# Patient Record
Sex: Female | Born: 1990 | Race: Black or African American | Hispanic: No | Marital: Single | Smoking: Never smoker
Health system: Southern US, Community
[De-identification: ages and names within clinical notes are randomized; demographics above are authoritative.]

## PROBLEM LIST (undated history)

## (undated) ENCOUNTER — Inpatient Hospital Stay (HOSPITAL_COMMUNITY): Payer: Self-pay

## (undated) DIAGNOSIS — M51369 Other intervertebral disc degeneration, lumbar region without mention of lumbar back pain or lower extremity pain: Secondary | ICD-10-CM

## (undated) DIAGNOSIS — G4730 Sleep apnea, unspecified: Secondary | ICD-10-CM

## (undated) DIAGNOSIS — M5136 Other intervertebral disc degeneration, lumbar region: Secondary | ICD-10-CM

## (undated) DIAGNOSIS — R7303 Prediabetes: Secondary | ICD-10-CM

## (undated) DIAGNOSIS — T8859XA Other complications of anesthesia, initial encounter: Secondary | ICD-10-CM

## (undated) DIAGNOSIS — E282 Polycystic ovarian syndrome: Secondary | ICD-10-CM

---
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
## 2013-10-05 ENCOUNTER — Emergency Department (HOSPITAL_COMMUNITY)
Admission: EM | Admit: 2013-10-05 | Discharge: 2013-10-05 | Disposition: A | Discharge status: Home / Self Care | Payer: Self-pay | Attending: Emergency Medicine | Admitting: Emergency Medicine

## 2013-10-05 ENCOUNTER — Encounter (HOSPITAL_COMMUNITY): Payer: Self-pay | Admitting: Emergency Medicine

## 2013-10-05 DIAGNOSIS — R109 Unspecified abdominal pain: Secondary | ICD-10-CM | POA: Insufficient documentation

## 2013-10-05 DIAGNOSIS — R110 Nausea: Secondary | ICD-10-CM | POA: Insufficient documentation

## 2013-10-05 DIAGNOSIS — J069 Acute upper respiratory infection, unspecified: Secondary | ICD-10-CM | POA: Insufficient documentation

## 2013-10-05 LAB — RAPID STREP SCREEN (MED CTR MEBANE ONLY): Streptococcus, Group A Screen (Direct): NEGATIVE

## 2013-10-05 NOTE — ED Provider Notes (Signed)
CSN: [id number]     Arrival date & time 10/05/13  1429 History   First MD Initiated Contact with Patient 10/05/13 1548     Chief Complaint  Patient presents with  . Sore Throat  . Nasal Congestion  . Abdominal Pain   (Consider location/radiation/quality/duration/timing/severity/associated sxs/prior Treatment) The history is provided by the patient. No language interpreter was used.  Emily Morgan is a 22 y/o F with no significant PMHx presenting to the ED with sore throat, cough, and nasal congestion that has been ongoing for the past 5 days. Patient reported that the headache is localized to the temple region, described as a pressure sensation. Patient reported that the cough that she has is productive, reported yellowish, greenish phlegm. Patient reported that her son recently got over a viral infection. Patient reported that her significant other just was treated for pneumonia. Denied fever, chills, vomiting, chest pain. PCP none   History reviewed. No pertinent past medical history. History reviewed. No pertinent past surgical history. History reviewed. No pertinent family history. History  Substance Use Topics  . Smoking status: Never Smoker   . Smokeless tobacco: Not on file  . Alcohol Use: Yes     Comment: 3-4 times a week   OB History   Grav Para Term Preterm Abortions TAB SAB Ect Mult Living                 Review of Systems  Constitutional: Positive for chills. Negative for fever.  HENT: Positive for congestion. Negative for trouble swallowing.   Respiratory: Positive for cough and chest tightness.   Cardiovascular: Negative for chest pain.  Gastrointestinal: Positive for nausea. Negative for vomiting, abdominal pain and diarrhea.  Musculoskeletal: Negative for back pain.  Neurological: Positive for headaches. Negative for dizziness, tremors and weakness.  All other systems reviewed and are negative.    Allergies  Review of patient's allergies indicates no known  allergies.  Home Medications  No current outpatient prescriptions on file. BP 111/66  Pulse 101  Temp(Src) 98.2 F (36.8 C) (Oral)  Resp 16  SpO2 98% Physical Exam  Nursing note and vitals reviewed. Constitutional: She is oriented to person, place, and time. She appears well-developed and well-nourished. No distress.  HENT:  Head: Normocephalic and atraumatic.  Mouth/Throat: Oropharynx is clear and moist. No oropharyngeal exudate.  Negative swelling, erythema, inflammation, lesions, sores exudate, petechiae noted to the posterior oropharynx and tonsils. Uvula midline, symmetrical elevation.   Eyes: Conjunctivae and EOM are normal. Pupils are equal, round, and reactive to light. Right eye exhibits no discharge. Left eye exhibits no discharge.  Neck: Normal range of motion. Neck supple.  Cardiovascular: Normal rate, regular rhythm and normal heart sounds.  Exam reveals no friction rub.   No murmur heard. Pulses:      Radial pulses are 2+ on the right side, and 2+ on the left side.  Pulmonary/Chest: Effort normal and breath sounds normal. No respiratory distress. She has no wheezes. She has no rales.  Musculoskeletal: Normal range of motion.  Lymphadenopathy:    She has no cervical adenopathy.  Neurological: She is oriented to person, place, and time. She exhibits normal muscle tone. Coordination normal.  Skin: Skin is warm and dry. No rash noted. She is not diaphoretic. No erythema.  Psychiatric: She has a normal mood and affect. Her behavior is normal. Thought content normal.    ED Course  Procedures (including critical care time)  Results for orders placed during the hospital encounter of 10/05/13  RAPID STREP SCREEN      Result Value Range   Streptococcus, Group A Screen (Direct) NEGATIVE  NEGATIVE    Labs Review Labs Reviewed  RAPID STREP SCREEN  CULTURE, GROUP A STREP   Imaging Review No results found.  EKG Interpretation   None       MDM   1. URI (upper  respiratory infection)    Filed Vitals:   10/05/13 1437 10/05/13 1730  BP: 125/72 111/66  Pulse: 101   Temp: 98.2 F (36.8 C)   TempSrc: Oral   Resp: 16   SpO2: 98%    I personally performed the services described in this documentation, which was scribed in my presence. The recorded information has been reviewed and is accurate.  Patient presenting to the ED with cough, nasal congestion, chills that has been ongoing for the past 5 days. Patient reported positive sick contacts with husband being treated for pneumonia and son having a viral infection.  Alert and oriented. Heart rate and rhythm normal. Lungs clear to auscultation to upper and lower lobes bilaterally. Full ROM to upper and lower extremities bilaterally. Unremarkable oral exam - negative swelling, erythema, inflammation, lesions, sores, petechiae, exudate noted to the posterior oropharynx and tonsils. Negative meningeal signs.  Rapid strep test negative.  Doubt strep pharyngitis. Doubt meningitis. Doubt pneumonia. Suspicion to be URI, viral infection. Patient stable, afebrile. Discharged patient. Discussed with patient to rest and stay hydrated. Discussed with patient supportive care. Referred to health and wellness center. Discussed with patient to closely monitor symptoms and if symptoms are to worsen or change to report back to the ED - strict return instructions given. Patient agreed to plan of care, understood, all questions answered.     Raymon Mutton, PA-C 10/06/13 [phone]

## 2013-10-05 NOTE — ED Notes (Signed)
For 5 days, c/o abdominal pain, headache and sore throat, c/o congestion, coughing, little nausea, no vomiting

## 2013-10-07 LAB — CULTURE, GROUP A STREP

## 2013-10-08 NOTE — ED Provider Notes (Signed)
Medical screening examination/treatment/procedure(s) were performed by non-physician practitioner and as supervising physician I was immediately available for consultation/collaboration.  EKG Interpretation   None         Suzi Roots, MD 10/08/13 [phone]

## 2014-09-24 ENCOUNTER — Emergency Department (HOSPITAL_COMMUNITY)
Admission: EM | Admit: 2014-09-24 | Discharge: 2014-09-25 | Disposition: A | Discharge status: Home / Self Care | Payer: Self-pay | Attending: Emergency Medicine | Admitting: Emergency Medicine

## 2014-09-24 ENCOUNTER — Encounter (HOSPITAL_COMMUNITY): Payer: Self-pay | Admitting: Emergency Medicine

## 2014-09-24 DIAGNOSIS — N760 Acute vaginitis: Secondary | ICD-10-CM | POA: Insufficient documentation

## 2014-09-24 DIAGNOSIS — Z792 Long term (current) use of antibiotics: Secondary | ICD-10-CM | POA: Insufficient documentation

## 2014-09-24 DIAGNOSIS — B3731 Acute candidiasis of vulva and vagina: Secondary | ICD-10-CM

## 2014-09-24 DIAGNOSIS — Z3202 Encounter for pregnancy test, result negative: Secondary | ICD-10-CM | POA: Insufficient documentation

## 2014-09-24 DIAGNOSIS — B373 Candidiasis of vulva and vagina: Secondary | ICD-10-CM | POA: Insufficient documentation

## 2014-09-24 LAB — POC URINE PREG, ED: Preg Test, Ur: NEGATIVE

## 2014-09-24 NOTE — ED Notes (Signed)
Pt states she was seen at [facility] 2 to 3 days ago and was diagnosed with a UTI  Pt states she does not think she has a UTI because she is not having frequency or dysuria  Pt states she does have pressure in her perineum and feels like she is swollen and states she has stinging and itching

## 2014-09-25 LAB — WET PREP, GENITAL
CLUE CELLS WET PREP: NONE SEEN
TRICH WET PREP: NONE SEEN

## 2014-09-25 LAB — URINALYSIS, ROUTINE W REFLEX MICROSCOPIC
Bilirubin Urine: NEGATIVE
Glucose, UA: NEGATIVE mg/dL
Ketones, ur: NEGATIVE mg/dL
Nitrite: NEGATIVE
Protein, ur: 30 mg/dL — AB
Specific Gravity, Urine: 1.035 — ABNORMAL HIGH (ref 1.005–1.030)
Urobilinogen, UA: 1 mg/dL (ref 0.0–1.0)
pH: 6 (ref 5.0–8.0)

## 2014-09-25 LAB — URINE MICROSCOPIC-ADD ON

## 2014-09-25 MED ORDER — FLUCONAZOLE 150 MG PO TABS
150.0000 mg | ORAL_TABLET | Freq: Once | ORAL | Status: AC
  Administered 2014-09-25: 150 mg via ORAL
  Filled 2014-09-25: qty 1

## 2014-09-25 NOTE — Discharge Instructions (Signed)

## 2014-09-25 NOTE — ED Provider Notes (Signed)
CSN: [id number]     Arrival date & time 11/25409811914/15  2240 History   First MD Initiated Contact with Patient 09/24/14 2310     Chief Complaint  Patient presents with  . Vaginal Itching     (Consider location/radiation/quality/duration/timing/severity/associated sxs/prior Treatment) HPI Comments: Patient presents today with a chief complaint of vaginal itching and irritation.  She reports that she was seen in the ED three days ago at [facility].  At that time she was diagnosed with a UTI.  She was given a Rx for Bactrim DS, which she has been taking.  She does not feel that her symptoms are improving.  She reports that she never had any urinary symptoms and therefore, does not feel that she has a UTI.  She denies any urinary symptoms at this time.  No fever, chills, nausea, vomiting, or flank pain.    Patient is a 23 y.o. female presenting with vaginal itching. The history is provided by the patient.  Vaginal Itching    History reviewed. No pertinent past medical history. Past Surgical History  Procedure Laterality Date  . Cesarean section     History reviewed. No pertinent family history. History  Substance Use Topics  . Smoking status: Never Smoker   . Smokeless tobacco: Not on file  . Alcohol Use: Yes     Comment: 3-4 times a week   OB History    No data available     Review of Systems  All other systems reviewed and are negative.     Allergies  Review of patient's allergies indicates no known allergies.  Home Medications   Prior to Admission medications   Medication Sig Start Date End Date Taking? Authorizing Provider  Aspirin-Salicylamide-Caffeine (BC HEADACHE POWDER PO) Take 1 packet by mouth every 4 (four) hours as needed (pain).   Yes Historical Provider, MD  sulfamethoxazole-trimethoprim (BACTRIM DS,SEPTRA DS) 800-160 MG per tablet Take 1 tablet by mouth 2 (two) times daily. For 10 days   Yes Historical Provider, MD   BP 150/77 mmHg  Pulse 84   Temp(Src) 98.4 F (36.9 C) (Oral)  Resp 20  SpO2 100%  LMP 09/03/2014 (Approximate) Physical Exam  Constitutional: She appears well-developed and well-nourished.  HENT:  Head: Normocephalic and atraumatic.  Mouth/Throat: Oropharynx is clear and moist.  Eyes: EOM are normal.  Neck: Normal range of motion.  Cardiovascular: Normal rate, regular rhythm and normal heart sounds.   Pulmonary/Chest: Effort normal and breath sounds normal.  Abdominal: Soft. Bowel sounds are normal. She exhibits no distension and no mass. There is no tenderness. There is no rebound and no guarding.  Genitourinary: Cervix exhibits no motion tenderness. Right adnexum displays no mass, no tenderness and no fullness. Left adnexum displays no mass, no tenderness and no fullness.  Whitish colored discharge present in the vaginal vault  Musculoskeletal: Normal range of motion.  Neurological: She is alert.  Skin: Skin is warm and dry.  Psychiatric: She has a normal mood and affect.  Nursing note and vitals reviewed.   ED Course  Procedures (including critical care time) Labs Review Labs Reviewed  WET PREP, GENITAL - Abnormal; Notable for the following:    Yeast Wet Prep HPF POC FEW (*)    WBC, Wet Prep HPF POC TOO NUMEROUS TO COUNT (*)    All other components within normal limits  GC/CHLAMYDIA PROBE AMP  URINALYSIS, ROUTINE W REFLEX MICROSCOPIC  POC URINE PREG, ED    Imaging Review No results found.  EKG Interpretation None      MDM   Final diagnoses:  None   Patient presenting with a chief complaint of vaginal itching and irritation.  Thick whitish colored discharge on exam consistent with Candida.  Wet prep showing yeast.  No CMT or adnexal tenderness on exam.  Urine pregnancy negative.  Patient given Diflucan.  She is currently taking Bactrim DS to treat a UTI.  Urine cultured.  She is stable for discharge.  Return precautions given.      Santiago GladHeather , PA-C 09/26/14 2151  Santiago GladHeather  , PA-C 09/26/14 [id number]  Elwin MochaBlair Walden, MD 09/29/14 [phone]

## 2014-09-26 LAB — URINE CULTURE
CULTURE: NO GROWTH
Colony Count: NO GROWTH

## 2014-09-26 LAB — GC/CHLAMYDIA PROBE AMP
CT PROBE, AMP APTIMA: NEGATIVE
GC Probe RNA: NEGATIVE

## 2015-11-25 ENCOUNTER — Encounter (HOSPITAL_COMMUNITY): Payer: Self-pay

## 2015-11-25 ENCOUNTER — Emergency Department (HOSPITAL_COMMUNITY)
Admission: EM | Admit: 2015-11-25 | Discharge: 2015-11-25 | Disposition: A | Discharge status: Home / Self Care | Payer: Managed Care, Other (non HMO) | Attending: Emergency Medicine | Admitting: Emergency Medicine

## 2015-11-25 DIAGNOSIS — R110 Nausea: Secondary | ICD-10-CM | POA: Diagnosis not present

## 2015-11-25 DIAGNOSIS — R197 Diarrhea, unspecified: Secondary | ICD-10-CM | POA: Diagnosis not present

## 2015-11-25 DIAGNOSIS — J029 Acute pharyngitis, unspecified: Secondary | ICD-10-CM | POA: Diagnosis present

## 2015-11-25 DIAGNOSIS — J069 Acute upper respiratory infection, unspecified: Secondary | ICD-10-CM | POA: Insufficient documentation

## 2015-11-25 LAB — RAPID STREP SCREEN (MED CTR MEBANE ONLY): STREPTOCOCCUS, GROUP A SCREEN (DIRECT): NEGATIVE

## 2015-11-25 NOTE — ED Provider Notes (Signed)
CSN: [id number]     Arrival date & time 11/25/15  1057 History   First MD Initiated Contact with Patient 11/25/15 1152     Chief Complaint  Patient presents with  . URI  . Sore Throat     (Consider location/radiation/quality/duration/timing/severity/associated sxs/prior Treatment) Patient is a 25 y.o. female presenting with URI and pharyngitis. The history is provided by the patient and medical records.  URI Presenting symptoms: congestion, cough, fever and sore throat   Sore Throat Associated symptoms include congestion, coughing, a fever and a sore throat.    25 y.o. F seen yesterday at [facility], started on azithromycin and has taken 1 doses thus far.  She reports nasal congestion, sore throat, sinus congestion x 3 days.  Also reports cough that is productive intermittently.  No chest pain or SOB.  She reports nausea without vomiting, diarrhea x3.  Fever 102F orally x2 over the past 3 days.  Been taking motrin and tylenol at home.  Biggest issues is that she breaths through her mouth at night which causes sore throat.  Also states she needs a work note.  VSS.  History reviewed. No pertinent past medical history. Past Surgical History  Procedure Laterality Date  . Cesarean section     No family history on file. Social History  Substance Use Topics  . Smoking status: Never Smoker   . Smokeless tobacco: None  . Alcohol Use: Yes     Comment: 3-4 times a week   OB History    No data available     Review of Systems  Constitutional: Positive for fever.  HENT: Positive for congestion and sore throat.   Respiratory: Positive for cough.   All other systems reviewed and are negative.     Allergies  Review of patient's allergies indicates no known allergies.  Home Medications   Prior to Admission medications   Medication Sig Start Date End Date Taking? Authorizing Provider  Aspirin-Salicylamide-Caffeine (BC HEADACHE POWDER PO) Take 1 packet by mouth every 4 (four) hours as needed  (pain).    Historical Provider, MD  sulfamethoxazole-trimethoprim (BACTRIM DS,SEPTRA DS) 800-160 MG per tablet Take 1 tablet by mouth 2 (two) times daily. For 10 days    Historical Provider, MD   BP 111/73 mmHg  Pulse 87  Temp(Src) 97.7 F (36.5 C) (Oral)  Resp 16  Ht  (1.727 m)  Wt 115.214 kg  BMI 38.63 kg/m2  SpO2 99%  LMP 11/04/2015   Physical Exam  Constitutional: She is oriented to person, place, and time. She appears well-developed and well-nourished. No distress.  HENT:  Head: Normocephalic and atraumatic.  Right Ear: Tympanic membrane and ear canal normal.  Left Ear: Ear canal normal.  Nose: Mucosal edema present. Right sinus exhibits maxillary sinus tenderness. Left sinus exhibits maxillary sinus tenderness.  Mouth/Throat: Uvula is midline and mucous membranes are normal. Posterior oropharyngeal erythema present. No oropharyngeal exudate, posterior oropharyngeal edema or tonsillar abscesses.  Tonsils normal in appearance bilaterally without exudate; slight oropharyngeal erythema, no edema; uvula midline without peritonsillar abscess; handling secretions appropriately; no difficulty swallowing or speaking  Eyes: Conjunctivae and EOM are normal. Pupils are equal, round, and reactive to light.  Neck: Normal range of motion.  Cardiovascular: Normal rate, regular rhythm and normal heart sounds.   Pulmonary/Chest: Effort normal and breath sounds normal. No respiratory distress. She has no wheezes.  Abdominal: Soft. Bowel sounds are normal.  Musculoskeletal: Normal range of motion.  Neurological: She is alert and oriented to person,  place, and time.  Skin: Skin is warm and dry. She is not diaphoretic.  Psychiatric: She has a normal mood and affect.  Nursing note and vitals reviewed.   ED Course  Procedures (including critical care time) Labs Review Labs Reviewed  RAPID STREP SCREEN (NOT AT [facility])  CULTURE, GROUP A STREP [facility])    Imaging Review No results found. I  have personally reviewed and evaluated these images and lab results as part of my medical decision-making.   EKG Interpretation None      MDM   Final diagnoses:  URI (upper respiratory infection)   25 year old female here with cough and sore throat. Seen yesterday at [facility], I reviewed chart-- patient was presumably diagnosed with pneumonia, she did not have chest x-ray performed. Patient is afebrile and nontoxic on exam. Her lungs are clear without wheezes or rhonchi. She has slight erythema of her oropharynx without tonsillar edema or exudates. Rapid strep was sent and is negative, culture pending. I feel there is a strong possibility that her symptoms are viral in nature, however since she has already started the azithromycin for coverage of CAP i have encouraged her to finish this. Do not feel she needs imaging at this time given she is already on abx.  I've instructed her she may also take over-the-counter decongestants to help with symptoms.  Discussed plan with patient, he/she acknowledged understanding and agreed with plan of care.  Return precautions given for new or worsening symptoms.   Garlon Hatchet, PA-C 11/25/15 1301  Laurence Spates, MD 11/25/15 [phone]

## 2015-11-25 NOTE — Discharge Instructions (Signed)
Finish the azithromycin you have been prescribed. You may take tylenol or motrin as needed. Return here for new concerns.

## 2015-11-25 NOTE — ED Notes (Signed)
She c/o cough/congestion and sore throat for ~5 days.  She was seen at [facility], who rx with azithromycin  A couple of days ago--"not better yet".

## 2015-11-27 LAB — CULTURE, GROUP A STREP (THRC)

## 2016-01-28 ENCOUNTER — Emergency Department (HOSPITAL_COMMUNITY)
Admission: EM | Admit: 2016-01-28 | Discharge: 2016-01-28 | Discharge status: Against Medical Advice | Payer: Managed Care, Other (non HMO)

## 2016-01-28 NOTE — ED Notes (Signed)
Pt. Has been called 3 times and no answer

## 2017-01-15 ENCOUNTER — Emergency Department (HOSPITAL_COMMUNITY): Payer: Managed Care, Other (non HMO)

## 2017-01-15 ENCOUNTER — Encounter (HOSPITAL_COMMUNITY): Payer: Self-pay | Admitting: Emergency Medicine

## 2017-01-15 ENCOUNTER — Emergency Department (HOSPITAL_COMMUNITY)
Admission: EM | Admit: 2017-01-15 | Discharge: 2017-01-15 | Disposition: A | Discharge status: Home / Self Care | Payer: Managed Care, Other (non HMO) | Attending: Emergency Medicine | Admitting: Emergency Medicine

## 2017-01-15 DIAGNOSIS — M25572 Pain in left ankle and joints of left foot: Secondary | ICD-10-CM | POA: Diagnosis not present

## 2017-01-15 DIAGNOSIS — Z5321 Procedure and treatment not carried out due to patient leaving prior to being seen by health care provider: Secondary | ICD-10-CM | POA: Diagnosis not present

## 2017-01-15 NOTE — ED Triage Notes (Signed)
Pt c/o L ankle pain that woke her from sleep. Pt states [facility] and did a lot of running, pt c/o L ankle pain at rest and with activity. No swelling noted, pt denies injury.

## 2017-01-15 NOTE — ED Notes (Signed)
Called for room x2 no response. 

## 2017-01-15 NOTE — ED Notes (Signed)
Called x1 no response

## 2017-01-15 NOTE — ED Notes (Signed)
Call x 3 no response

## 2017-01-15 NOTE — ED Notes (Signed)
Bed: WTR8 Expected date:  Expected time:  Means of arrival:  Comments: 

## 2017-01-16 ENCOUNTER — Encounter (HOSPITAL_COMMUNITY): Payer: Self-pay | Admitting: Emergency Medicine

## 2017-01-16 ENCOUNTER — Emergency Department (HOSPITAL_COMMUNITY)
Admission: EM | Admit: 2017-01-16 | Discharge: 2017-01-16 | Disposition: A | Discharge status: Home / Self Care | Payer: Managed Care, Other (non HMO) | Attending: Emergency Medicine | Admitting: Emergency Medicine

## 2017-01-16 ENCOUNTER — Emergency Department (HOSPITAL_COMMUNITY): Payer: Managed Care, Other (non HMO)

## 2017-01-16 DIAGNOSIS — Z7982 Long term (current) use of aspirin: Secondary | ICD-10-CM | POA: Diagnosis not present

## 2017-01-16 DIAGNOSIS — M25572 Pain in left ankle and joints of left foot: Secondary | ICD-10-CM | POA: Diagnosis present

## 2017-01-16 NOTE — ED Triage Notes (Signed)
Pt reports ongoing L ankle pain since Sunday am. No known injury.

## 2017-01-16 NOTE — ED Provider Notes (Signed)
[facility]-EMERGENCY DEPT Provider Note   CSN: [id number] Arrival date & time: 01/16/17  1118  By signing my name below, I, Sonum Patel, attest that this documentation has been prepared under the direction and in the presence of [employer], PA-C. Electronically Signed: Sonum Patel, Scribe. 01/16/17. 1:19 PM.  History   Chief Complaint Chief Complaint  Patient presents with  . Ankle Pain    The history is provided by the patient. No language interpreter was used.     HPI Comments: Emily Morgan is a 26 y.o. female who presents to the Emergency Department complaining of constant, gradually worsened left ankle pain that began yesterday morning. She states she went to a trampoline park but denies any known injury or trauma to the affected area. She denies associated swelling.    History reviewed. No pertinent past medical history.  There are no active problems to display for this patient.   Past Surgical History:  Procedure Laterality Date  . CESAREAN SECTION      OB History    No data available       Home Medications    Prior to Admission medications   Medication Sig Start Date End Date Taking? Authorizing Provider  Aspirin-Salicylamide-Caffeine (BC HEADACHE POWDER PO) Take 1 packet by mouth every 4 (four) hours as needed (pain).    Historical Provider, MD  sulfamethoxazole-trimethoprim (BACTRIM DS,SEPTRA DS) 800-160 MG per tablet Take 1 tablet by mouth 2 (two) times daily. For 10 days    Historical Provider, MD    Family History History reviewed. No pertinent family history.  Social History Social History  Substance Use Topics  . Smoking status: Never Smoker  . Smokeless tobacco: Never Used  . Alcohol use Yes     Comment: 3-4 times a week     Allergies   Patient has no known allergies.   Review of Systems Review of Systems  Musculoskeletal: Positive for arthralgias. Negative for joint swelling.  Neurological: Negative for numbness.     Physical  Exam Updated Vital Signs BP 116/80 (BP Location: Left Arm)   Pulse 82   Temp 98.1 F (36.7 C) (Oral)   Resp 18   Ht 5\' 8"  (1.727 m)   Wt 117.9 kg   LMP 12/25/2016   SpO2 100%   BMI 39.53 kg/m   Physical Exam  Constitutional: She is oriented to person, place, and time. She appears well-developed and well-nourished.  HENT:  Head: Normocephalic and atraumatic.  Cardiovascular: Normal rate.   Pulmonary/Chest: Effort normal.  Musculoskeletal:  No significant tenderness to palpation of the left ankle.  Patient has medial ankle pain with inversion, no significant pain with eversion.  Ankle is stable  Neurological: She is alert and oriented to person, place, and time.  Skin: Skin is warm and dry.  Psychiatric: She has a normal mood and affect.  Nursing note and vitals reviewed.    ED Treatments / Results  DIAGNOSTIC STUDIES: Oxygen Saturation is 100% on RA, normal by my interpretation.    COORDINATION OF CARE: 1:20 PM Discussed treatment plan with pt at bedside and pt agreed to plan.   Labs (all labs ordered are listed, but only abnormal results are displayed) Labs Reviewed - No data to display  EKG  EKG Interpretation None       Radiology Dg Ankle Complete Left  Result Date: 01/16/2017 CLINICAL DATA:  Medial left ankle pain, no injury. EXAM: LEFT ANKLE COMPLETE - 3+ VIEW COMPARISON:  None. FINDINGS: No acute osseous or  joint abnormality. IMPRESSION: No acute osseous or joint abnormality. Electronically Signed   By: Leanna BattlesMelinda  Blietz M.D.   On: 01/16/2017 12:47    Procedures Procedures (including critical care time)  Medications Ordered in ED Medications - No data to display   Initial Impression / Assessment and Plan / ED Course  I have reviewed the triage vital signs and the nursing notes.  Pertinent labs & imaging results that were available during my care of the patient were reviewed by me and considered in my medical decision making (see chart for  details).     26 year old female presents today with ankle sprain.  No significant signs of trauma on exam, symptomatic care instructions given, follow-up information given.  She verbalized understanding and agreement to today's plan had no further questions or concerns  Final Clinical Impressions(s) / ED Diagnoses   Final diagnoses:  Acute left ankle pain    New Prescriptions Discharge Medication List as of 01/16/2017  1:23 PM     I personally performed the services described in this documentation, which was scribed in my presence. The recorded information has been reviewed and is accurate.   Eyvonne MechanicJeffrey , PA-C 01/16/17 1926    Doug SouSam Jacubowitz, MD 01/16/17 [phone]

## 2017-01-16 NOTE — Discharge Instructions (Signed)
Please read attached information. If you experience any new or worsening signs or symptoms please return to the emergency room for evaluation. Please follow-up with your primary care provider or specialist as discussed.  °

## 2017-01-16 NOTE — ED Notes (Signed)
Attempted to discharge pt but pt was not found in the room.  Searched for pt in dept and pt was not found.  Pt last seen a/o x 4 and ambulatory. Pt assumed to have left without receiving discharge paperwork.

## 2018-10-26 ENCOUNTER — Encounter (HOSPITAL_COMMUNITY): Payer: Self-pay | Admitting: Emergency Medicine

## 2018-10-26 ENCOUNTER — Emergency Department (HOSPITAL_COMMUNITY)
Admission: EM | Admit: 2018-10-26 | Discharge: 2018-10-26 | Disposition: A | Discharge status: Home / Self Care | Payer: Managed Care, Other (non HMO) | Attending: Emergency Medicine | Admitting: Emergency Medicine

## 2018-10-26 ENCOUNTER — Emergency Department (HOSPITAL_COMMUNITY): Payer: Managed Care, Other (non HMO)

## 2018-10-26 DIAGNOSIS — B9789 Other viral agents as the cause of diseases classified elsewhere: Secondary | ICD-10-CM | POA: Insufficient documentation

## 2018-10-26 DIAGNOSIS — R05 Cough: Secondary | ICD-10-CM

## 2018-10-26 DIAGNOSIS — J069 Acute upper respiratory infection, unspecified: Secondary | ICD-10-CM | POA: Insufficient documentation

## 2018-10-26 DIAGNOSIS — R059 Cough, unspecified: Secondary | ICD-10-CM

## 2018-10-26 LAB — GROUP A STREP BY PCR: Group A Strep by PCR: NOT DETECTED

## 2018-10-26 MED ORDER — BENZONATATE 100 MG PO CAPS: 100.0000 mg | ORAL_CAPSULE | Freq: Three times a day (TID) | ORAL | 0 refills | Status: DC | PRN

## 2018-10-26 MED ORDER — IPRATROPIUM-ALBUTEROL 0.5-2.5 (3) MG/3ML IN SOLN
3.0000 mL | Freq: Once | RESPIRATORY_TRACT | Status: AC
  Administered 2018-10-26: 3 mL via RESPIRATORY_TRACT
  Filled 2018-10-26: qty 3

## 2018-10-26 NOTE — ED Triage Notes (Signed)
Pt c/o productive cough, body aches, congestion and sore throat since Tuesday.

## 2018-10-26 NOTE — ED Notes (Signed)
RT called r/t neb treatment

## 2018-10-26 NOTE — ED Notes (Signed)
Patient transported to X-ray 

## 2018-10-26 NOTE — Discharge Instructions (Signed)
It was my pleasure taking care of you today!   Your symptoms are likely due to a viral upper respiratory infection. Fortunately, we did not see evidence of serious infection and can treat your symptoms. Flonase for nasal congestion, tessalon as needed for cough. Alternate between Tylenol and ibuprofen as needed for body aches / fevers.   Rest, drink plenty of fluids to be sure you are staying hydrated.   Please follow up with your primary doctor for discussion of your diagnoses and further evaluation after today's visit if symptoms persist longer than 7 days; Return to the ER for high fevers, difficulty breathing or other concerning symptoms

## 2018-10-26 NOTE — ED Notes (Signed)
Patient transported to Ultrasound 

## 2018-10-26 NOTE — ED Provider Notes (Signed)
[facility]-EMERGENCY DEPT Provider Note   CSN: [id number] Arrival date & time: 10/26/18  0845     History   Chief Complaint Chief Complaint  Patient presents with  . Cough  . Nasal Congestion  . Generalized Body Aches    HPI Emily Morgan is a 27 y.o. female.  The history is provided by the patient and medical records. No language interpreter was used.  Cough  Associated symptoms include chills, sore throat, myalgias and wheezing. Pertinent negatives include no chest pain, no headaches and no shortness of breath.   Emily Morgan is an otherwise healthy 27 y.o. female who presents to the Emergency Department complaining of persistent cough, congestion, sore throat, body aches and fevers for 4 days. Tmax 102 at home. Taking OTC anti-pyretics which improve fevers/body aches temporarily, but sxs keep returning. No hx of asthma or lung disease. Feels as if she is wheezing, but has never wheezed before so she is unsure. No abdominal pain, n/v/d.   History reviewed. No pertinent past medical history.  There are no active problems to display for this patient.   Past Surgical History:  Procedure Laterality Date  . CESAREAN SECTION       OB History   No obstetric history on file.      Home Medications    Prior to Admission medications   Medication Sig Start Date End Date Taking? Authorizing Provider  Aspirin-Salicylamide-Caffeine (BC HEADACHE POWDER PO) Take 1 packet by mouth every 4 (four) hours as needed (pain).    [provider]  benzonatate (TESSALON) 100 MG capsule Take 1 capsule (100 mg total) by mouth 3 (three) times daily as needed for cough. 10/26/18   , Chase PicketJaime Pilcher, PA-C  sulfamethoxazole-trimethoprim (BACTRIM DS,SEPTRA DS) 800-160 MG per tablet Take 1 tablet by mouth 2 (two) times daily. For 10 days    [provider]    Family History No family history on file.  Social History Social History   Tobacco Use  .  Smoking status: Never Smoker  . Smokeless tobacco: Never Used  Substance Use Topics  . Alcohol use: Yes    Comment: 3-4 times a week  . Drug use: No     Allergies   Patient has no known allergies.   Review of Systems Review of Systems  Constitutional: Positive for chills and fever.  HENT: Positive for congestion and sore throat.   Respiratory: Positive for cough and wheezing. Negative for shortness of breath.   Cardiovascular: Negative for chest pain.  Gastrointestinal: Negative for abdominal pain, diarrhea, nausea and vomiting.  Genitourinary: Negative for dysuria, urgency and vaginal discharge.  Musculoskeletal: Positive for myalgias.  Skin: Negative for rash.  Neurological: Negative for headaches.     Physical Exam Updated Vital Signs BP 123/74 (BP Location: Left Arm)   Pulse 91   Temp 98.6 F (37 C) (Oral)   Resp 18   LMP 08/31/2018   SpO2 97%   Physical Exam Vitals signs and nursing note reviewed.  Constitutional:      General: She is not in acute distress.    Appearance: She is well-developed.     Comments: Nontoxic-appearing.  HENT:     Head: Normocephalic and atraumatic.     Mouth/Throat:     Comments: OP with erythema, mild tonsillar hypertrophy. No exudates.  Cardiovascular:     Rate and Rhythm: Normal rate and regular rhythm.     Heart sounds: Normal heart sounds. No murmur.  Pulmonary:  Effort: Pulmonary effort is normal. No respiratory distress.     Breath sounds: Normal breath sounds.     Comments: Faint expiratory wheezing bilaterally. No crackles. Speaking in full sentences without any difficulty.  Abdominal:     General: There is no distension.     Palpations: Abdomen is soft.     Tenderness: There is no abdominal tenderness.  Skin:    General: Skin is warm and dry.  Neurological:     Mental Status: She is alert and oriented to person, place, and time.      ED Treatments / Results  Labs (all labs ordered are listed, but only  abnormal results are displayed) Labs Reviewed  GROUP A STREP BY PCR    EKG None  Radiology Dg Chest 2 View  Result Date: 10/26/2018 CLINICAL DATA:  Cough EXAM: CHEST - 2 VIEW COMPARISON:  None available FINDINGS: Normal heart size and mediastinal contours. No acute infiltrate or edema. No effusion or pneumothorax. No acute osseous findings. IMPRESSION: Negative chest. Electronically Signed   By: Marnee SpringJonathon  Watts M.D.   On: 10/26/2018 09:37    Procedures Procedures (including critical care time)  Medications Ordered in ED Medications  ipratropium-albuterol (DUONEB) 0.5-2.5 (3) MG/3ML nebulizer solution 3 mL (3 mLs Nebulization Given 10/26/18 0955)     Initial Impression / Assessment and Plan / ED Course  I have reviewed the triage vital signs and the nursing notes.  Pertinent labs & imaging results that were available during my care of the patient were reviewed by me and considered in my medical decision making (see chart for details).    Emily Morgan is a 27 y.o. female who presents to ED for cough, congestion, fevers, sore throat, body aches. Most recent temp of 100 last night. No fever this am or in ED. No anti-pyretics today.    On exam, patient is afebrile, non-toxic appearing with a clear lung exam. Mild rhinorrhea and OP with erythema but no exudates.  CXR without PNA. Rapid strep negative.  Sxs today likely due to viral URI.Symptomatic home care instructions discussed. Rx for Tessalon given. PCP follow up strongly encouraged if symptoms persist. Reasons to return to ER discussed. All questions answered.   Blood pressure 123/74, pulse 91, temperature 98.6 F (37 C), temperature source Oral, resp. rate 18, last menstrual period 08/31/2018, SpO2 97 %.   Final Clinical Impressions(s) / ED Diagnoses   Final diagnoses:  Cough  Viral URI with cough    ED Discharge Orders         Ordered    benzonatate (TESSALON) 100 MG capsule  3 times daily PRN     10/26/18 1011             , Chase PicketJaime Pilcher, PA-C 10/26/18 1032    Maia PlanLong, Joshua G, MD 10/26/18 1839

## 2020-10-20 ENCOUNTER — Other Ambulatory Visit: Payer: Self-pay

## 2020-10-20 ENCOUNTER — Emergency Department (HOSPITAL_COMMUNITY): Payer: Self-pay

## 2020-10-20 ENCOUNTER — Encounter (HOSPITAL_COMMUNITY): Payer: Self-pay

## 2020-10-20 ENCOUNTER — Emergency Department (HOSPITAL_COMMUNITY)
Admission: EM | Admit: 2020-10-20 | Discharge: 2020-10-20 | Disposition: A | Discharge status: Home / Self Care | Payer: Self-pay | Attending: Emergency Medicine | Admitting: Emergency Medicine

## 2020-10-20 DIAGNOSIS — Z20822 Contact with and (suspected) exposure to covid-19: Secondary | ICD-10-CM | POA: Insufficient documentation

## 2020-10-20 DIAGNOSIS — J069 Acute upper respiratory infection, unspecified: Secondary | ICD-10-CM | POA: Insufficient documentation

## 2020-10-20 HISTORY — DX: Other intervertebral disc degeneration, lumbar region: M51.36

## 2020-10-20 HISTORY — DX: Polycystic ovarian syndrome: E28.2

## 2020-10-20 HISTORY — DX: Other intervertebral disc degeneration, lumbar region without mention of lumbar back pain or lower extremity pain: M51.369

## 2020-10-20 LAB — GROUP A STREP BY PCR: Group A Strep by PCR: NOT DETECTED

## 2020-10-20 LAB — RESP PANEL BY RT-PCR (FLU A&B, COVID) ARPGX2
Influenza A by PCR: NEGATIVE
Influenza B by PCR: NEGATIVE
SARS Coronavirus 2 by RT PCR: NEGATIVE

## 2020-10-20 LAB — POC URINE PREG, ED: Preg Test, Ur: NEGATIVE

## 2020-10-20 MED ORDER — PREDNISONE 20 MG PO TABS: 40.0000 mg | ORAL_TABLET | Freq: Every day | ORAL | 0 refills | Status: AC

## 2020-10-20 NOTE — ED Notes (Signed)
Discharge paperwork reviewed with pt, including prescription.  Pt with no questions or concerns at time of discharge, ambulatory to ED exit.  

## 2020-10-20 NOTE — Discharge Instructions (Signed)
Take prednisone as directed.  You can also use over-the-counter cough medication such as Delsym.  You have a Covid test pending.  You can check online or on the MyChart app regarding results.  Return to the emergency department for any difficulty breathing, fevers, vomiting or any other worsening concerning symptoms.

## 2020-10-20 NOTE — ED Provider Notes (Signed)
[facility]-EMERGENCY DEPT Provider Note   CSN: [id number] Arrival date & time: 10/20/20  0941     History Chief Complaint  Patient presents with  . Cough  . Sore Throat  . Generalized Body Aches    Emily Morgan is a 29 y.o. female who presents for evaluation of cough that is been ongoing for 2 weeks.  She has had some associated rhinorrhea, congestion, sore throat.  No fevers.  He states cough is productive of green phlegm.  She reports that she was tested for Covid about a week ago and was negative.  She has continued to have cough since then.  She got a rapid test done today that was negative.  She has been vaccinated for Covid.  No known Covid exposure that she knows of.  She has been able to tolerate her secretions and tolerate p.o.  She does report some worsening pain in her throat with swallowing but has been able to swallow.  She is not any difficulty breathing, vomiting.  She does not smoke.  No prior history of asthma or COPD.  The history is provided by the patient.       Past Medical History:  Diagnosis Date  . Degenerative lumbar disc   . PCOS (polycystic ovarian syndrome)     There are no problems to display for this patient.   Past Surgical History:  Procedure Laterality Date  . CESAREAN SECTION       OB History   No obstetric history on file.     Family History  Problem Relation Age of Onset  . Hypertension Mother   . Cancer Father     Social History   Tobacco Use  . Smoking status: Never Smoker  . Smokeless tobacco: Never Used  Vaping Use  . Vaping Use: Never used  Substance Use Topics  . Alcohol use: Yes    Comment: 3-4 times a week  . Drug use: No    Home Medications Prior to Admission medications   Medication Sig Start Date End Date Taking? Authorizing Provider  Aspirin-Salicylamide-Caffeine (BC HEADACHE POWDER PO) Take 1 packet by mouth every 4 (four) hours as needed (pain).    [provider]   benzonatate (TESSALON) 100 MG capsule Take 1 capsule (100 mg total) by mouth 3 (three) times daily as needed for cough. 10/26/18   Ward, Chase Picket, PA-C  predniSONE (DELTASONE) 20 MG tablet Take 2 tablets (40 mg total) by mouth daily for 4 days. 10/20/20 10/24/20  Maxwell Caul, PA-C  sulfamethoxazole-trimethoprim (BACTRIM DS,SEPTRA DS) 800-160 MG per tablet Take 1 tablet by mouth 2 (two) times daily. For 10 days    [provider]    Allergies    Shellfish allergy  Review of Systems   Review of Systems  Constitutional: Positive for fatigue. Negative for fever.  HENT: Positive for congestion and sore throat.   Respiratory: Positive for cough. Negative for shortness of breath.   All other systems reviewed and are negative.   Physical Exam Updated Vital Signs BP 110/89 (BP Location: Right Arm)   Pulse 77   Temp 98.6 F (37 C) (Oral)   Resp 16   Ht 5\' 8"  (1.727 m)   Wt 113.4 kg   LMP  (LMP Unknown)   SpO2 97%   BMI 38.01 kg/m   Physical Exam Vitals and nursing note reviewed.  Constitutional:      Appearance: She is well-developed and well-nourished.  HENT:  Head: Normocephalic and atraumatic.     Mouth/Throat:     Comments: Posterior oropharynx is slightly erythematous.  No edema, exudates.  Uvula is midline.  Airways patent, phonation is intact. Eyes:     General: No scleral icterus.       Right eye: No discharge.        Left eye: No discharge.     Extraocular Movements: EOM normal.     Conjunctiva/sclera: Conjunctivae normal.  Pulmonary:     Effort: Pulmonary effort is normal.     Comments: Lungs clear to auscultation bilaterally.  Symmetric chest rise.  No wheezing, rales, rhonchi. Skin:    General: Skin is warm and dry.  Neurological:     Mental Status: She is alert.  Psychiatric:        Mood and Affect: Mood and affect normal.        Speech: Speech normal.        Behavior: Behavior normal.     ED Results / Procedures / Treatments    Labs (all labs ordered are listed, but only abnormal results are displayed) Labs Reviewed  GROUP A STREP BY PCR  RESP PANEL BY RT-PCR (FLU A&B, COVID) ARPGX2  POC URINE PREG, ED    EKG None  Radiology DG Chest Portable 1 View  Result Date: 10/20/2020 CLINICAL DATA:  Cough, sore throat, and body aches for the past 2 weeks. EXAM: PORTABLE CHEST 1 VIEW COMPARISON:  Chest x-ray dated October 03, 2019 FINDINGS: The heart size and mediastinal contours are within normal limits. Both lungs are clear. The visualized skeletal structures are unremarkable. IMPRESSION: No active disease. Electronically Signed   By: Obie Dredge M.D.   On: 10/20/2020 12:59    Procedures Procedures (including critical care time)  Medications Ordered in ED Medications - No data to display  ED Course  I have reviewed the triage vital signs and the nursing notes.  Pertinent labs & imaging results that were available during my care of the patient were reviewed by me and considered in my medical decision making (see chart for details).    MDM Rules/Calculators/A&P                          29 year old female who presents for evaluation of cough x2 weeks.  Associated with sore throat, nasal congestion, rhinorrhea.  No difficulty breathing.  She has been vaccinated for Covid.  Nausea arrival, she is afebrile, nontoxic-appearing.  Vital signs are stable.  Lungs clear to auscultation.  No wheezing.  Posterior oropharynx is clear with some mild erythema but no edema, exudates.  History/physical exam not concerning for peritonsillar abscess, Ludwig angina.  Concern for viral process such as COVID-19 versus other infectious process.  Also consider pharyngitis.  Given that has been ongoing for 2 weeks, will obtain chest x-ray.  We also discussed repeating a Covid test that was PCR here in the department.  Strep negative.  Chest x-ray negative for any infectious etiology.  Discussed results with patient.  We will plan  to put her on a short course of prednisone to help with cough. At this time, patient exhibits no emergent life-threatening condition that require further evaluation in ED. Patient had ample opportunity for questions and discussion. All patient's questions were answered with full understanding. Strict return precautions discussed. Patient expresses understanding and agreement to plan.   Portions of this note were generated with Scientist, clinical (histocompatibility and immunogenetics). Dictation errors may occur despite best attempts  at proofreading.  Final Clinical Impression(s) / ED Diagnoses Final diagnoses:  Viral URI with cough    Rx / DC Orders ED Discharge Orders         Ordered    predniSONE (DELTASONE) 20 MG tablet  Daily        10/20/20 1320           Rosana Hoes 10/20/20 1328    Gwyneth Sprout, MD 10/20/20 1515

## 2020-10-20 NOTE — ED Triage Notes (Signed)
patient c/o cough, sore throat , and body aches x 2 weeks. Patient states she tested Covid negative 6 days ago and was negative with a Covid rapid test today.

## 2021-05-29 ENCOUNTER — Encounter (HOSPITAL_BASED_OUTPATIENT_CLINIC_OR_DEPARTMENT_OTHER): Payer: Self-pay | Admitting: *Deleted

## 2021-05-29 ENCOUNTER — Emergency Department (HOSPITAL_BASED_OUTPATIENT_CLINIC_OR_DEPARTMENT_OTHER)
Admission: EM | Admit: 2021-05-29 | Discharge: 2021-05-29 | Disposition: A | Discharge status: Home / Self Care | Payer: BC Managed Care – PPO | Attending: Emergency Medicine | Admitting: Emergency Medicine

## 2021-05-29 DIAGNOSIS — J029 Acute pharyngitis, unspecified: Secondary | ICD-10-CM

## 2021-05-29 DIAGNOSIS — U071 COVID-19: Secondary | ICD-10-CM | POA: Diagnosis not present

## 2021-05-29 LAB — RESP PANEL BY RT-PCR (FLU A&B, COVID) ARPGX2
Influenza A by PCR: NEGATIVE
Influenza B by PCR: NEGATIVE
SARS Coronavirus 2 by RT PCR: POSITIVE — AB

## 2021-05-29 LAB — GROUP A STREP BY PCR: Group A Strep by PCR: NOT DETECTED

## 2021-05-29 NOTE — Discharge Instructions (Addendum)
Your COVID test is pending.  Please follow-up in MyChart.

## 2021-05-29 NOTE — ED Notes (Signed)
Covid Swab and Rapid Strep to lab

## 2021-05-29 NOTE — ED Provider Notes (Signed)
[facility] EMERGENCY DEPT Provider Note   CSN: [id number] Arrival date & time: 05/29/21  1041     History Chief Complaint  Patient presents with   Sore Throat    Emily Morgan is a 30 y.o. female.  HPI 30 year old female with history of PCOS presents today complaining of a sore throat.  She states she began having some scratchy throat yesterday.  Today she is hoarse and has a sore throat.  She has not had fever or chills.  She denies any nasal congestion, cough, or dyspnea.  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  She has had her COVID vaccines but has not been boosted yet.  She denies any headache, head injury, pregnancy, visual changes, eye difficulty speaking, breathing, or eating.    Past Medical History:  Diagnosis Date   Degenerative lumbar disc    PCOS (polycystic ovarian syndrome)     There are no problems to display for this patient.   Past Surgical History:  Procedure Laterality Date   CESAREAN SECTION       OB History   No obstetric history on file.     Family History  Problem Relation Age of Onset   Hypertension Mother    Cancer Father     Social History   Tobacco Use   Smoking status: Never   Smokeless tobacco: Never  Vaping Use   Vaping Use: Never used  Substance Use Topics   Alcohol use: Yes    Comment: 3-4 times a week   Drug use: No    Home Medications Prior to Admission medications   Medication Sig Start Date End Date Taking? Authorizing Provider  Aspirin-Salicylamide-Caffeine (BC HEADACHE POWDER PO) Take 1 packet by mouth every 4 (four) hours as needed (pain).    [provider]  benzonatate (TESSALON) 100 MG capsule Take 1 capsule (100 mg total) by mouth 3 (three) times daily as needed for cough. 10/26/18   Ward, Chase Picket, PA-C  sulfamethoxazole-trimethoprim (BACTRIM DS,SEPTRA DS) 800-160 MG per tablet Take 1 tablet by mouth 2 (two) times daily. For 10 days     [provider]    Allergies    Shellfish allergy  Review of Systems   Review of Systems  All other systems reviewed and are negative.  Physical Exam Updated Vital Signs BP (!) 143/85 (BP Location: Right Arm)   Pulse 99   Temp 98.5 F (36.9 C) (Oral)   Resp 18   Ht 1.727 m (5\' 8" )   Wt 117.9 kg   SpO2 100%   BMI 39.53 kg/m   Physical Exam Vitals and nursing note reviewed.  Constitutional:      General: She is not in acute distress.    Appearance: She is well-developed.  HENT:     Head: Normocephalic and atraumatic.     Right Ear: Ear canal and external ear normal.     Left Ear: Ear canal and external ear normal.     Nose: Nose normal. No congestion.     Mouth/Throat:     Mouth: Mucous membranes are moist.     Pharynx: Oropharynx is clear.     Tonsils: No tonsillar exudate or tonsillar abscesses.  Eyes:     Conjunctiva/sclera: Conjunctivae normal.     Pupils: Pupils are equal, round, and reactive to light.  Pulmonary:     Effort: Pulmonary effort is normal.  Musculoskeletal:        General: Normal  range of motion.     Cervical back: Normal range of motion and neck supple.  Skin:    General: Skin is warm and dry.  Neurological:     Mental Status: She is alert and oriented to person, place, and time.     Motor: No abnormal muscle tone.     Coordination: Coordination normal.  Psychiatric:        Behavior: Behavior normal.        Thought Content: Thought content normal.    ED Results / Procedures / Treatments   Labs (all labs ordered are listed, but only abnormal results are displayed) Labs Reviewed  GROUP A STREP BY PCR  RESP PANEL BY RT-PCR (FLU A&B, COVID) ARPGX2    EKG None  Radiology No results found.  Procedures Procedures   Medications Ordered in ED Medications - No data to display  ED Course  I have reviewed the triage vital signs and the nursing notes.  Pertinent labs & imaging results that were available during my care of  the patient were reviewed by me and considered in my medical decision making (see chart for details).    MDM Rules/Calculators/A&P                           30 year old female presents today complaining of sore throat.  Strep here is negative.  COVID test is pending.  I discussed return precautions and need for follow-up.  She will follow-up her COVID test in MyChart.  She appears stable for discharge. Final Clinical Impression(s) / ED Diagnoses Final diagnoses:  Sore throat    Rx / DC Orders ED Discharge Orders     None        Margarita Grizzle, MD 05/29/21 1521

## 2021-05-29 NOTE — ED Triage Notes (Signed)
C/o sore throat, onset yesterday, denies fevers. Voice changed and feels fatigued, had an exposure to Covid at work

## 2021-07-14 ENCOUNTER — Ambulatory Visit (HOSPITAL_BASED_OUTPATIENT_CLINIC_OR_DEPARTMENT_OTHER): Payer: Self-pay | Admitting: Physical Therapy

## 2021-07-21 ENCOUNTER — Encounter (HOSPITAL_BASED_OUTPATIENT_CLINIC_OR_DEPARTMENT_OTHER): Payer: Self-pay

## 2021-07-21 ENCOUNTER — Ambulatory Visit (HOSPITAL_BASED_OUTPATIENT_CLINIC_OR_DEPARTMENT_OTHER): Payer: Self-pay | Attending: Orthopedic Surgery | Admitting: Physical Therapy

## 2021-10-04 NOTE — Therapy (Addendum)
OUTPATIENT PHYSICAL THERAPY CERVICAL EVALUATION   Patient Name: Emily Morgan MRN: [id number] DOB:03-20-1991, 30 y.o., female Today's Date: 10/05/2021   PT End of Session - 10/05/21 2302     Visit Number 1    Number of Visits 8    Date for PT Re-Evaluation 11/02/21    Authorization Type [employer]    PT Start Time 1311    PT Stop Time 1400    PT Time Calculation (min) 49 min    Activity Tolerance Patient tolerated treatment well    Behavior During Therapy WFL for tasks assessed/performed             Past Medical History:  Diagnosis Date   Degenerative lumbar disc    PCOS (polycystic ovarian syndrome)    Past Surgical History:  Procedure Laterality Date   CESAREAN SECTION     There are no problems to display for this patient.   PCP: Patient, No Pcp Per (Inactive)  REFERRING PROVIDER: Rhodia Albright, PA-C  REFERRING DIAG: M54.2 (ICD-10-CM) - Cervicalgia   THERAPY DIAG:  Chronic low back pain, unspecified back pain laterality, unspecified whether sciatica present  Right shoulder pain, unspecified chronicity  Cervicalgia  Muscle weakness (generalized)  ONSET DATE: 06/21/2021  SUBJECTIVE:                                                                                                                                                                                                         SUBJECTIVE STATEMENT: Pt reports her neck and shoulder pain began on 06/21/2021.  Pt turned her head while putting a shirt on and felt a pop in her neck.  She had pain and limited mobility after the pop.  Pt went to see MD 1 week after the onset.  Pt had x rays.  Pt was given the option of meds or PT.  Pt didn't want to use meds and chose PT.  MD referral indicated evaluate neck and back pain.  1-2x/wk x 1-2 weeks, establish HEP.  Pt reports her neck is feeling much better and she is not having any cervical pain.  Her cervical pain began improving 1 month after onset.  She continues to  have pain in R shoulder.  Pt has increased R shoulder pain with reaching behind back including to don/doff bra.  She has pain lying on R shoulder.  Pt has disturbed sleep due to R shoulder and back pain. Pt denies shoulder pain at rest though has increased pain with activity.   Pt states she always has lower back pain.  Pt  reports her lumbar pain has improved since her work changed.  She is not performing any bending and lifting.  Pt has increased pain with prolonged ambulation and standing still.  She feels better leaning forward if standing.  Pt is trying to lose weight though has increased pain with ambulation.      PERTINENT HISTORY:  Lumbar degenerative disc and Hx of lumbar pain  PAIN:  Are you having pain? Yes VAS scale: 0/10 cervical and shoulder, worst 6-7/10 in shoulder (sleeping/reaching behind back); 3/10 lumbar current and best, 10/10 worst Pain description: constant for lumbar Aggravating factors: walking, reaching behind back Relieving factors: rest, heating pad  PRECAUTIONS: Other: Hx of lumbar pain  WEIGHT BEARING RESTRICTIONS No  FALLS:  Has patient fallen in last 6 months? No    OCCUPATION: Engineer, site.  Pt has to perform a combination of sitting, standing, and walking.  Pt is not limited with work activities.   PLOF: Independent  PATIENT GOALS less back pain.  Take longer walks with less pain  OBJECTIVE:   DIAGNOSTIC FINDINGS:  X rays of cervical:  Pt states they informed her that her cervical spine had straightened and she didn't have the normal curve.      SENSATION: Light touch: 2+ sensation to LT t/o bilat UE dermatomes   POSTURE:  Pt had tightness and tenderness in R UT.  TTP superior R shoulder and none in ant, lat, and post shoulder.  Pt had minimal tightness in upper to mid lumbar paraspinals.  Pt has tenderness in lower lumbar paraspinals.     CERVICAL AROM  AROM AROM (deg) 10/05/2021  Flexion WFL   Extension WFL  Right lateral  flexion 35  Left lateral flexion 32  Right rotation WNL  Left rotation WNL  No Pain with cervical ROM  LUMBAR AROM  AROM AROM (deg) 10/05/2021  Flexion WNL  Extension 50% with pain  Right lateral flexion WFL  Left lateral flexion WFL  Right rotation WNL  Left rotation WNL    UE AROM/PROM:  A/PROM Right 10/05/2021 Left 10/05/2021  Shoulder flexion 177 178  Shoulder extension    Shoulder abduction    Shoulder adduction    Shoulder extension    Shoulder internal rotation    Shoulder external rotation    Elbow flexion    Elbow extension    Wrist flexion    Wrist extension    Wrist ulnar deviation    Wrist radial deviation    Wrist pronation    Wrist supination     (Blank rows = not tested)  LE STRENGTH:  MMT Right 10/05/2021 Left 10/05/2021  Shoulder flexion 4/5 with pain 4+/5  Shoulder extension    Shoulder abduction 4+/5 with pain 5/5  Hip flexion 5/5 5/5  Hip extension 5/5 5/5  Hip ER 5/5 5/5  Knee Extension 5/5 5/5    CERVICAL SPECIAL TESTS:  Compression Test:  negative. Spurling's Test:  negative bilat    PATIENT SURVEYS:  FOTO 87 Modified Oswestry:  32%  TODAY'S TREATMENT:  See below for pt education.    PATIENT EDUCATION:  Education details: POC and rationale for exercises and manual techniques.  Dx and prognosis.  Answered Pt's questions. Person educated: Patient Education method: Explanation Education comprehension: verbalized understanding   HOME EXERCISE PROGRAM: Will give next visit.    ASSESSMENT:  CLINICAL IMPRESSION: Patient is a 30 y.o. female who presents to the clinic with a dx of cervicalgia and MD order to treat neck and back  pain.  Pt states her cervical pain improved greatly and she is not having any cervical pain.  She continues to have R shoulder pain.  Pt has increased R shoulder pain with reaching behind back and with activity.  Pt has disturbed sleep due to R shoulder and back pain.  Pt has increased lumbar pain with  ambulation and is unable to perform her walking program due to lumbar pain.  Pt has good strength in bilat LE's; though has weakness in R shoulder.  Pt has tightness and tenderness in UT.  Pt has good cervical and lumbar AROM except some limitations with lumbar ext and cervical SB'ing.  Pt should benefit from skilled PT services to address above impairments and improve overall function.  Objective impairments include decreased activity tolerance, decreased mobility, decreased ROM, decreased strength, hypomobility, impaired flexibility, impaired UE functional use, and pain. These impairments are limiting patient from  dressing and ambulation . Personal factors including chronic lumbar pain are also affecting patient's functional outcome.   REHAB POTENTIAL: Good  CLINICAL DECISION MAKING: Stable/uncomplicated  EVALUATION COMPLEXITY: Low   GOALS:   SHORT TERM GOALS:  STG Name Target Date Goal status  1 Pt will be independent and compliant with HEP for improved pain, strength, and function.  Baseline:  10/26/2021 INITIAL  2 Pt will report improved usage of R UE with daily activities with less pain.  Baseline:  10/26/2021  INITIAL  3 Pt will report reduced pain with her daily ambulation. Baseline: 10/19/2021 INITIAL                       LONG TERM GOALS:   LTG Name Target Date Goal status  1 Pt will be able to sleep at least 5/7 nights per week without shoulder or back pain waking her up. Baseline: 11/02/2021 INITIAL  2 Pt will be able to ambulate extended community distance without significant lumbar pain to assist with returning to her walking program.  Baseline: 11/02/2021 INITIAL  3 Pt will demo improved core strength as evidenced by progression of core exercises without adverse effects for improved performance of ambulation and ADLs/IADLs. Baseline: 11/02/2021 INITIAL  4 Pt will demo improved soft tissue tightness in R UT for reduced cervical tension and shoulder pain and for improved  efficiency in R UE usage.  Baseline: 11/02/2021 INITIAL                  PLAN: PT FREQUENCY: 1-2x/week  PT DURATION: 4 weeks  PLANNED INTERVENTIONS: Therapeutic exercises, Therapeutic activity, Neuro Muscular re-education, Gait training, Patient/Family education, Joint mobilization, Stair training, Aquatic Therapy, Dry Needling, Electrical stimulation, Spinal mobilization, Cryotherapy, Moist heat, Taping, Traction, Ultrasound, and Manual therapy  PLAN FOR NEXT SESSION: STM/IASTM to R UT and lumbar paraspinals.  Scapular/posture strengthening and stabilization.  Core strengthening for lumbar.    Audie Clear III PT, DPT 10/06/21 2:32 PM  PHYSICAL THERAPY DISCHARGE SUMMARY  Visits from Start of Care: 1  Current functional level related to goals / functional outcomes: Unable to assess current functional status due to pt not being present at discharge.    Remaining deficits: Unable to assess current functional status due to pt not being present at discharge.   Education / Equipment: See above   Pt was evaluated on 10/05/2021 and did not schedule any further appointments after evaluatoin.  Unable to assess goals due to pt not being present at discharge. Patient is being discharged due to not returning since the last visit.  Audie Clear III PT, DPT 04/30/22 9:24 AM

## 2021-10-05 ENCOUNTER — Ambulatory Visit (HOSPITAL_BASED_OUTPATIENT_CLINIC_OR_DEPARTMENT_OTHER): Payer: BC Managed Care – PPO | Attending: Orthopedic Surgery | Admitting: Physical Therapy

## 2021-10-05 ENCOUNTER — Encounter (HOSPITAL_BASED_OUTPATIENT_CLINIC_OR_DEPARTMENT_OTHER): Payer: Self-pay | Admitting: Physical Therapy

## 2021-10-05 ENCOUNTER — Other Ambulatory Visit: Payer: Self-pay

## 2021-10-05 DIAGNOSIS — M542 Cervicalgia: Secondary | ICD-10-CM | POA: Insufficient documentation

## 2021-10-05 DIAGNOSIS — M25511 Pain in right shoulder: Secondary | ICD-10-CM | POA: Insufficient documentation

## 2021-10-05 DIAGNOSIS — M6281 Muscle weakness (generalized): Secondary | ICD-10-CM | POA: Diagnosis present

## 2021-10-05 DIAGNOSIS — M545 Low back pain, unspecified: Secondary | ICD-10-CM | POA: Insufficient documentation

## 2021-10-05 DIAGNOSIS — G8929 Other chronic pain: Secondary | ICD-10-CM | POA: Insufficient documentation

## 2022-02-24 ENCOUNTER — Other Ambulatory Visit: Payer: Self-pay

## 2022-02-24 ENCOUNTER — Encounter (HOSPITAL_BASED_OUTPATIENT_CLINIC_OR_DEPARTMENT_OTHER): Payer: Self-pay

## 2022-02-24 ENCOUNTER — Emergency Department (HOSPITAL_BASED_OUTPATIENT_CLINIC_OR_DEPARTMENT_OTHER): Payer: No Typology Code available for payment source | Admitting: Radiology

## 2022-02-24 DIAGNOSIS — R0602 Shortness of breath: Secondary | ICD-10-CM | POA: Insufficient documentation

## 2022-02-24 DIAGNOSIS — Z5321 Procedure and treatment not carried out due to patient leaving prior to being seen by health care provider: Secondary | ICD-10-CM | POA: Insufficient documentation

## 2022-02-24 DIAGNOSIS — R7989 Other specified abnormal findings of blood chemistry: Secondary | ICD-10-CM | POA: Diagnosis not present

## 2022-02-24 DIAGNOSIS — F0789 Other personality and behavioral disorders due to known physiological condition: Secondary | ICD-10-CM | POA: Diagnosis not present

## 2022-02-24 DIAGNOSIS — R079 Chest pain, unspecified: Secondary | ICD-10-CM | POA: Insufficient documentation

## 2022-02-24 LAB — CBC
HCT: 33.3 % — ABNORMAL LOW (ref 36.0–46.0)
Hemoglobin: 10.1 g/dL — ABNORMAL LOW (ref 12.0–15.0)
MCH: 24.3 pg — ABNORMAL LOW (ref 26.0–34.0)
MCHC: 30.3 g/dL (ref 30.0–36.0)
MCV: 80.2 fL (ref 80.0–100.0)
Platelets: 425 10*3/uL — ABNORMAL HIGH (ref 150–400)
RBC: 4.15 MIL/uL (ref 3.87–5.11)
RDW: 14.2 % (ref 11.5–15.5)
WBC: 5.9 10*3/uL (ref 4.0–10.5)
nRBC: 0 % (ref 0.0–0.2)

## 2022-02-24 LAB — BASIC METABOLIC PANEL
Anion gap: 9 (ref 5–15)
BUN: 10 mg/dL (ref 6–20)
CO2: 23 mmol/L (ref 22–32)
Calcium: 10.2 mg/dL (ref 8.9–10.3)
Chloride: 103 mmol/L (ref 98–111)
Creatinine, Ser: 0.71 mg/dL (ref 0.44–1.00)
GFR, Estimated: >60 mL/min (ref >60)
Glucose, Bld: 115 mg/dL — ABNORMAL HIGH (ref 70–99)
Potassium: 3.5 mmol/L (ref 3.5–5.1)
Sodium: 135 mmol/L (ref 135–145)

## 2022-02-24 LAB — TROPONIN I (HIGH SENSITIVITY): Troponin I (High Sensitivity): <2 ng/L (ref <18)

## 2022-02-24 NOTE — ED Triage Notes (Signed)
C/o of chest pain starting at 10 am today. Denies n/v. Pt verbalized having some SOB with exertion. Pt describes pain as central and throbbing.  ?

## 2022-02-25 ENCOUNTER — Emergency Department (HOSPITAL_BASED_OUTPATIENT_CLINIC_OR_DEPARTMENT_OTHER)
Admission: EM | Admit: 2022-02-25 | Discharge: 2022-02-25 | Disposition: A | Discharge status: Home / Self Care | Payer: No Typology Code available for payment source | Attending: Emergency Medicine | Admitting: Emergency Medicine

## 2022-02-25 ENCOUNTER — Emergency Department (HOSPITAL_BASED_OUTPATIENT_CLINIC_OR_DEPARTMENT_OTHER)
Admission: EM | Admit: 2022-02-25 | Discharge: 2022-02-25 | Disposition: A | Discharge status: Home / Self Care | Payer: No Typology Code available for payment source | Source: Home / Self Care | Attending: Emergency Medicine | Admitting: Emergency Medicine

## 2022-02-25 ENCOUNTER — Encounter (HOSPITAL_BASED_OUTPATIENT_CLINIC_OR_DEPARTMENT_OTHER): Payer: Self-pay | Admitting: Emergency Medicine

## 2022-02-25 ENCOUNTER — Other Ambulatory Visit: Payer: Self-pay

## 2022-02-25 ENCOUNTER — Emergency Department (HOSPITAL_BASED_OUTPATIENT_CLINIC_OR_DEPARTMENT_OTHER): Payer: No Typology Code available for payment source

## 2022-02-25 DIAGNOSIS — R7989 Other specified abnormal findings of blood chemistry: Secondary | ICD-10-CM | POA: Insufficient documentation

## 2022-02-25 DIAGNOSIS — R0789 Other chest pain: Secondary | ICD-10-CM | POA: Insufficient documentation

## 2022-02-25 LAB — TROPONIN I (HIGH SENSITIVITY): Troponin I (High Sensitivity): 2 ng/L (ref <18)

## 2022-02-25 LAB — D-DIMER, QUANTITATIVE: D-Dimer, Quant: 0.68 ug/mL-FEU — ABNORMAL HIGH (ref 0.00–0.50)

## 2022-02-25 MED ORDER — IOHEXOL 350 MG/ML SOLN
100.0000 mL | Freq: Once | INTRAVENOUS | Status: AC | PRN
  Administered 2022-02-25: 100 mL via INTRAVENOUS

## 2022-02-25 MED ORDER — KETOROLAC TROMETHAMINE 30 MG/ML IJ SOLN
30.0000 mg | Freq: Once | INTRAMUSCULAR | Status: AC
  Administered 2022-02-25: 30 mg via INTRAVENOUS
  Filled 2022-02-25: qty 1

## 2022-02-25 NOTE — ED Provider Notes (Signed)
?[facility]PT ?Provider Note ? ? ?CSN: [id number] ?Arrival date & time: 02/25/22  0536 ? ?  ? ?History ? ?Chief Complaint  ?Patient presents with  ? Chest Pain  ? ? ?Emily Morgan is a 31 y.o. female. ? ?Patient is a 31 year old female with no significant past medical history.  Patient presenting here with complaints of chest discomfort.  This started yesterday morning in the absence of any injury or trauma.  She describes a constant sensation of heaviness to the front of her chest extending from her neck into the center of her chest.  There is no nausea, diaphoresis, shortness of breath, or radiation to the arm or jaw.  She denies any fevers or chills.  She denies any pleuritic component.  She denies any recent exertional symptoms. ? ?Patient initially presented here yesterday evening, but left prior to being seen due to prolonged wait time.  She had an EKG, chest x-ray, and laboratory studies performed, all of which were unremarkable. ? ?The history is provided by the patient.  ?Chest Pain ?Pain location:  Substernal area ?Pain radiates to:  Does not radiate ?Pain severity:  Moderate ?Onset quality:  Sudden ?Duration:  1 day ?Timing:  Constant ?Progression:  Unchanged ?Chronicity:  New ? ?  ? ?Home Medications ?Prior to Admission medications   ?Medication Sig Start Date End Date Taking? Authorizing Provider  ?Aspirin-Salicylamide-Caffeine (BC HEADACHE POWDER PO) Take 1 packet by mouth every 4 (four) hours as needed (pain). ?Patient not taking: Reported on 10/05/2021    [provider]  ?benzonatate (TESSALON) 100 MG capsule Take 1 capsule (100 mg total) by mouth 3 (three) times daily as needed for cough. ?Patient not taking: Reported on 10/05/2021 10/26/18   Ward, Chase Picket, PA-C  ?sulfamethoxazole-trimethoprim (BACTRIM DS,SEPTRA DS) 800-160 MG per tablet Take 1 tablet by mouth 2 (two) times daily. For 10 days ?Patient not taking: Reported on 10/05/2021    [provider]  ?   ? ?Allergies    ?Shellfish allergy   ? ?Review of Systems   ?Review of Systems  ?Cardiovascular:  Positive for chest pain.  ?All other systems reviewed and are negative. ? ?Physical Exam ?Updated Vital Signs ?BP 138/82 (BP Location: Right Arm)   Pulse 98   Temp 98.7 ?F (37.1 ?C) (Oral)   Resp 18   Ht 5\' 8"  (1.727 m)   Wt 122.5 kg   LMP 02/01/2022 (Exact Date)   SpO2 100%   BMI 41.05 kg/m?  ?Physical Exam ?Vitals and nursing note reviewed.  ?Constitutional:   ?   General: She is not in acute distress. ?   Appearance: She is well-developed. She is not diaphoretic.  ?HENT:  ?   Head: Normocephalic and atraumatic.  ?Cardiovascular:  ?   Rate and Rhythm: Normal rate and regular rhythm.  ?   Heart sounds: No murmur heard. ?  No friction rub. No gallop.  ?Pulmonary:  ?   Effort: Pulmonary effort is normal. No respiratory distress.  ?   Breath sounds: Normal breath sounds. No wheezing.  ?Abdominal:  ?   General: Bowel sounds are normal. There is no distension.  ?   Palpations: Abdomen is soft.  ?   Tenderness: There is no abdominal tenderness.  ?Musculoskeletal:     ?   General: Normal range of motion.  ?   Cervical back: Normal range of motion and neck supple.  ?   Right lower leg: No tenderness. No edema.  ?  Left lower leg: No tenderness. No edema.  ?Skin: ?   General: Skin is warm and dry.  ?Neurological:  ?   General: No focal deficit present.  ?   Mental Status: She is alert and oriented to person, place, and time.  ? ? ?ED Results / Procedures / Treatments   ?Labs ?(all labs ordered are listed, but only abnormal results are displayed) ?Labs Reviewed - No data to display ? ?EKG ?None ? ?Radiology ?DG Chest 2 View ? ?Result Date: 02/24/2022 ?CLINICAL DATA:  Chest pain EXAM: CHEST - 2 VIEW COMPARISON:  None. FINDINGS: The heart size and mediastinal contours are within normal limits. Both lungs are clear. The visualized skeletal structures are unremarkable. IMPRESSION: No active cardiopulmonary disease.  Electronically Signed   By: Helyn Numbers M.D.   On: 02/24/2022 21:59   ? ?Procedures ?Procedures  ? ? ?Medications Ordered in ED ?Medications  ?ketorolac (TORADOL) 30 MG/ML injection 30 mg (has no administration in time range)  ? ? ?ED Course/ Medical Decision Making/ A&P ? ?Patient presenting with complaints of chest discomfort as described in the HPI.  Her symptoms are somewhat atypical for cardiac pain and cardiac work-up thus far is unremarkable.  Patient does have a mildly elevated D-dimer and will undergo PE study to rule out this possibility.  Care signed out to Dr. Lockie Mola at shift change.  He will obtain the results of this study and determine the final disposition. ? ?Final Clinical Impression(s) / ED Diagnoses ?Final diagnoses:  ?None  ? ? ?Rx / DC Orders ?ED Discharge Orders   ? ? None  ? ?  ? ? ?  ?Geoffery Lyons, MD ?02/25/22 2303 ? ?

## 2022-02-25 NOTE — ED Triage Notes (Signed)
?  Patient comes in with mid sternal chest pain that started yesterday morning.  Patient states pain radiated to her neck but denies any N/V, or diaphoresis.  Patient endorses SOB when laying on her side or with a lot of exertion.  Pain 6/10, throbbing pain.   ?

## 2022-02-25 NOTE — ED Provider Notes (Signed)
Signed out to me awaiting CT scan of her chest. ? ?CT scan shows no acute findings.  No pneumonia, no clot.  Troponin is negative.  Overall work-up from her ED visits last night are unremarkable.  Suspect something muscular.  Discharged in good condition. ?  Lennice Sites, DO ?02/25/22 [id number] ? ?

## 2022-02-25 NOTE — ED Notes (Signed)
1st attempt - attempt to locate patient in ED waiting room; no answer   ?

## 2022-02-25 NOTE — Discharge Instructions (Signed)
Your work-up today does not show any evidence of blood clot or heart issues.  Suspect that this could be muscular.  Recommend taking 1000 mg of Tylenol every 6 hours as needed for pain.  Recommend taking 800 mg ibuprofen every 8 hours as needed for pain.  Continue to follow-up with your primary care doctor. ?

## 2022-02-25 NOTE — ED Notes (Signed)
Patient transported to CT 

## 2022-02-25 NOTE — ED Notes (Signed)
ED Provider at bedside. 

## 2022-06-28 ENCOUNTER — Emergency Department (HOSPITAL_BASED_OUTPATIENT_CLINIC_OR_DEPARTMENT_OTHER): Payer: No Typology Code available for payment source

## 2022-06-28 ENCOUNTER — Emergency Department (HOSPITAL_BASED_OUTPATIENT_CLINIC_OR_DEPARTMENT_OTHER)
Admission: EM | Admit: 2022-06-28 | Discharge: 2022-06-28 | Disposition: A | Discharge status: Home / Self Care | Payer: No Typology Code available for payment source | Attending: Emergency Medicine | Admitting: Emergency Medicine

## 2022-06-28 ENCOUNTER — Encounter (HOSPITAL_BASED_OUTPATIENT_CLINIC_OR_DEPARTMENT_OTHER): Payer: Self-pay

## 2022-06-28 DIAGNOSIS — K8020 Calculus of gallbladder without cholecystitis without obstruction: Secondary | ICD-10-CM | POA: Insufficient documentation

## 2022-06-28 DIAGNOSIS — Z20822 Contact with and (suspected) exposure to covid-19: Secondary | ICD-10-CM | POA: Insufficient documentation

## 2022-06-28 DIAGNOSIS — R109 Unspecified abdominal pain: Secondary | ICD-10-CM | POA: Diagnosis present

## 2022-06-28 LAB — COMPREHENSIVE METABOLIC PANEL
ALT: 20 U/L (ref 0–44)
AST: 16 U/L (ref 15–41)
Albumin: 3.8 g/dL (ref 3.5–5.0)
Alkaline Phosphatase: 39 U/L (ref 38–126)
Anion gap: 8 (ref 5–15)
BUN: 11 mg/dL (ref 6–20)
CO2: 23 mmol/L (ref 22–32)
Calcium: 9.4 mg/dL (ref 8.9–10.3)
Chloride: 103 mmol/L (ref 98–111)
Creatinine, Ser: 0.73 mg/dL (ref 0.44–1.00)
GFR, Estimated: >60 mL/min (ref >60)
Glucose, Bld: 106 mg/dL — ABNORMAL HIGH (ref 70–99)
Potassium: 3.8 mmol/L (ref 3.5–5.1)
Sodium: 134 mmol/L — ABNORMAL LOW (ref 135–145)
Total Bilirubin: 0.3 mg/dL (ref 0.3–1.2)
Total Protein: 8.2 g/dL — ABNORMAL HIGH (ref 6.5–8.1)

## 2022-06-28 LAB — URINALYSIS, ROUTINE W REFLEX MICROSCOPIC
Bilirubin Urine: NEGATIVE
Glucose, UA: NEGATIVE mg/dL
Hgb urine dipstick: NEGATIVE
Ketones, ur: NEGATIVE mg/dL
Leukocytes,Ua: NEGATIVE
Nitrite: NEGATIVE
Specific Gravity, Urine: 1.027 (ref 1.005–1.030)
pH: 6 (ref 5.0–8.0)

## 2022-06-28 LAB — CBC
HCT: 33.8 % — ABNORMAL LOW (ref 36.0–46.0)
Hemoglobin: 10.4 g/dL — ABNORMAL LOW (ref 12.0–15.0)
MCH: 24.3 pg — ABNORMAL LOW (ref 26.0–34.0)
MCHC: 30.8 g/dL (ref 30.0–36.0)
MCV: 79 fL — ABNORMAL LOW (ref 80.0–100.0)
Platelets: 327 10*3/uL (ref 150–400)
RBC: 4.28 MIL/uL (ref 3.87–5.11)
RDW: 14.6 % (ref 11.5–15.5)
WBC: 5.4 10*3/uL (ref 4.0–10.5)
nRBC: 0 % (ref 0.0–0.2)

## 2022-06-28 LAB — RESP PANEL BY RT-PCR (FLU A&B, COVID) ARPGX2
Influenza A by PCR: NEGATIVE
Influenza B by PCR: NEGATIVE
SARS Coronavirus 2 by RT PCR: NEGATIVE

## 2022-06-28 LAB — LIPASE, BLOOD: Lipase: 35 U/L (ref 11–51)

## 2022-06-28 LAB — PREGNANCY, URINE: Preg Test, Ur: NEGATIVE

## 2022-06-28 MED ORDER — ONDANSETRON 4 MG PO TBDP
4.0000 mg | ORAL_TABLET | Freq: Three times a day (TID) | ORAL | 0 refills | Status: DC | PRN
Start: 2022-06-28 — End: 2022-06-28

## 2022-06-28 MED ORDER — ONDANSETRON 4 MG PO TBDP: 4.0000 mg | ORAL_TABLET | Freq: Three times a day (TID) | ORAL | 0 refills | Status: DC | PRN

## 2022-06-28 NOTE — Discharge Instructions (Signed)
Please schedule follow-up with general surgery in clinic to discuss further management of your gallstones.  Your ultrasound showed evidence of gallstones without evidence of gallbladder infection.  If you develop worsening right upper quadrant pain, fever or chills, nausea vomiting and inability to tolerate oral intake, return to the emergency department as this could indicate infection requiring emergent surgery.

## 2022-06-28 NOTE — ED Triage Notes (Signed)
Pt states that she has been having R sided abd pain over the past several days with some nausea. Denies fevers.

## 2022-06-28 NOTE — ED Provider Notes (Signed)
[facility] EMERGENCY DEPT Provider Note   CSN: [id number] Arrival date & time: 06/28/22  0334     History  Chief Complaint  Patient presents with   Abdominal Pain    Emily Morgan is a 31 y.o. female.   Abdominal Pain Associated symptoms: nausea      31 year old female with medical history significant for PCOS and degenerative disc disease who presents to the emergency department with right-sided abdominal pain over the past several days.  She also endorses some nausea.  She is tolerating oral intake.  She denies any vomiting, diarrhea, fever or chills.  She occasionally endorses a sharp pain when she takes a deep breath.  Her pain is in her right upper quadrant and radiates to her right back.  No radiation of her pain to her shoulders.  She denies any dysuria or increased urinary frequency.  Denies any history of problems with her gallbladder.  Home Medications Prior to Admission medications   Medication Sig Start Date End Date Taking? Authorizing Provider  ondansetron (ZOFRAN-ODT) 4 MG disintegrating tablet Take 1 tablet (4 mg total) by mouth every 8 (eight) hours as needed. 06/28/22  Yes Ernie Avena, MD  benzonatate (TESSALON) 100 MG capsule Take 1 capsule (100 mg total) by mouth 3 (three) times daily as needed for cough. Patient not taking: Reported on 10/05/2021 10/26/18   Ward, Marijean Niemann Pilcher, PA-C  VIENVA 0.1-20 MG-MCG tablet Take 1 tablet by mouth daily. 05/09/22   [provider]  Vitamin D, Ergocalciferol, (DRISDOL) 1.25 MG (50000 UNIT) CAPS capsule Take 50,000 Units by mouth once a week. 04/28/22   [provider]      Allergies    Shellfish allergy    Review of Systems   Review of Systems  Gastrointestinal:  Positive for abdominal pain and nausea.  All other systems reviewed and are negative.   Physical Exam Updated Vital Signs BP 136/86 (BP Location: Right Arm)   Pulse 80   Temp 98.3 F (36.8 C) (Oral)   Resp 16   LMP  05/12/2022   SpO2 100%  Physical Exam Vitals and nursing note reviewed.  Constitutional:      General: She is not in acute distress.    Appearance: She is well-developed.  HENT:     Head: Normocephalic and atraumatic.  Eyes:     Conjunctiva/sclera: Conjunctivae normal.  Cardiovascular:     Rate and Rhythm: Normal rate and regular rhythm.     Heart sounds: No murmur heard. Pulmonary:     Effort: Pulmonary effort is normal. No respiratory distress.     Breath sounds: Normal breath sounds.  Abdominal:     Palpations: Abdomen is soft.     Tenderness: There is abdominal tenderness in the right upper quadrant. There is no guarding or rebound. Negative signs include Murphy's sign.  Musculoskeletal:        General: No swelling.     Cervical back: Neck supple.  Skin:    General: Skin is warm and dry.     Capillary Refill: Capillary refill takes less than 2 seconds.  Neurological:     Mental Status: She is alert.  Psychiatric:        Mood and Affect: Mood normal.     ED Results / Procedures / Treatments   Labs (all labs ordered are listed, but only abnormal results are displayed) Labs Reviewed  COMPREHENSIVE METABOLIC PANEL - Abnormal; Notable for the following components:      Result Value  Sodium 134 (*)    Glucose, Bld 106 (*)    Total Protein 8.2 (*)    All other components within normal limits  CBC - Abnormal; Notable for the following components:   Hemoglobin 10.4 (*)    HCT 33.8 (*)    MCV 79.0 (*)    MCH 24.3 (*)    All other components within normal limits  URINALYSIS, ROUTINE W REFLEX MICROSCOPIC - Abnormal; Notable for the following components:   Protein, ur TRACE (*)    All other components within normal limits  RESP PANEL BY RT-PCR (FLU A&B, COVID) ARPGX2  LIPASE, BLOOD  PREGNANCY, URINE    EKG None  Radiology US Abdomen Limited RUQ (LIVER/GB)  Result Date: 06/28/2022 CLINICAL DATA:  Acute right upper quadrant abdominal pain. EXAM: ULTRASOUND ABDOMEN  LIMITED RIGHT UPPER QUADRANT COMPARISON:  None Available. FINDINGS: Gallbladder: Cholelithiasis is noted without gallbladder wall thickening or pericholecystic fluid. No sonographic Murphy's sign is noted. Common bile duct: Diameter: 5 mm which is within normal limits. Liver: No focal lesion identified. Increased echogenicity of hepatic parenchyma is noted consistent with hepatic steatosis. Portal vein is patent on color Doppler imaging with normal direction of blood flow towards the liver. Other: None. IMPRESSION: Cholelithiasis without evidence of cholecystitis. Hepatic steatosis. Electronically Signed   By: Lupita Raider M.D.   On: 06/28/2022 08:46    Procedures Procedures    Medications Ordered in ED Medications - No data to display  ED Course/ Medical Decision Making/ A&P                           Medical Decision Making Amount and/or Complexity of Data Reviewed Labs: ordered. Radiology: ordered.  Risk Prescription drug management.   31 year old female with medical history significant for PCOS and degenerative disc disease who presents to the emergency department with right-sided abdominal pain over the past several days.  She also endorses some nausea.  She is tolerating oral intake.  She denies any vomiting, diarrhea, fever or chills.  She occasionally endorses a sharp pain when she takes a deep breath.  Her pain is in her right upper quadrant and radiates to her right back.  No radiation of her pain to her shoulders.  She denies any dysuria or increased urinary frequency.  Denies any history of problems with her gallbladder.  On arrival, the patient was vitally stable.  On exam, the patient had right upper quadrant tenderness to palpation, no rebound or guarding, negative Murphy sign.  Suspect symptomatic cholelithiasis.  Also considered peptic ulcer disease.  Lower suspicion for other acute intra-abdominal abnormality such as small bowel obstruction, appendicitis, diverticulitis,  pancreatitis.  Lower suspicion for PE and the patient is PERC negative.  Patient laboratory evaluation significant for CMP without elevation in LFTs, normal biliary function, normal renal function, no significant electrolyte abnormality, lipase normal, CBC without a leukocytosis, mild anemia to 10.4 that appears to be microcytic, urinalysis without evidence of UTI.  Right upper quadrant ultrasound was performed:  IMPRESSION:  Cholelithiasis without evidence of cholecystitis.    Hepatic steatosis.   The patient is well-appearing, tolerating oral intake, without nausea actively.  Occasionally experiencing episodes of symptomatic cholelithiasis.  We will provide a referral to general surgery for follow-up outpatient for consideration for elective cholecystectomy and further management.  Return precautions provided in the event of worsening symptoms.  A prescription for Zofran was provided.  Stable for discharge at this time.  Final Clinical  Impression(s) / ED Diagnoses Final diagnoses:  Calculus of gallbladder without cholecystitis without obstruction    Rx / DC Orders ED Discharge Orders          Ordered    Ambulatory referral to General Surgery        06/28/22 0857    ondansetron (ZOFRAN-ODT) 4 MG disintegrating tablet  Every 8 hours PRN        06/28/22 0858              Ernie Avena, MD 06/28/22 [phone]

## 2022-10-20 ENCOUNTER — Emergency Department (HOSPITAL_BASED_OUTPATIENT_CLINIC_OR_DEPARTMENT_OTHER)
Admission: EM | Admit: 2022-10-20 | Discharge: 2022-10-20 | Disposition: A | Discharge status: Home / Self Care | Payer: No Typology Code available for payment source | Attending: Emergency Medicine | Admitting: Emergency Medicine

## 2022-10-20 ENCOUNTER — Other Ambulatory Visit: Payer: Self-pay

## 2022-10-20 ENCOUNTER — Emergency Department (HOSPITAL_BASED_OUTPATIENT_CLINIC_OR_DEPARTMENT_OTHER): Payer: No Typology Code available for payment source

## 2022-10-20 ENCOUNTER — Encounter (HOSPITAL_BASED_OUTPATIENT_CLINIC_OR_DEPARTMENT_OTHER): Payer: Self-pay | Admitting: Emergency Medicine

## 2022-10-20 DIAGNOSIS — K8020 Calculus of gallbladder without cholecystitis without obstruction: Secondary | ICD-10-CM

## 2022-10-20 DIAGNOSIS — K8080 Other cholelithiasis without obstruction: Secondary | ICD-10-CM | POA: Insufficient documentation

## 2022-10-20 DIAGNOSIS — D72829 Elevated white blood cell count, unspecified: Secondary | ICD-10-CM | POA: Diagnosis not present

## 2022-10-20 DIAGNOSIS — R1013 Epigastric pain: Secondary | ICD-10-CM | POA: Diagnosis present

## 2022-10-20 LAB — CBC WITH DIFFERENTIAL/PLATELET
Abs Immature Granulocytes: 0.02 10*3/uL (ref 0.00–0.07)
Basophils Absolute: 0 10*3/uL (ref 0.0–0.1)
Basophils Relative: 1 %
Eosinophils Absolute: 0 10*3/uL (ref 0.0–0.5)
Eosinophils Relative: 1 %
HCT: 35.8 % — ABNORMAL LOW (ref 36.0–46.0)
Hemoglobin: 11 g/dL — ABNORMAL LOW (ref 12.0–15.0)
Immature Granulocytes: 0 %
Lymphocytes Relative: 31 %
Lymphs Abs: 1.7 10*3/uL (ref 0.7–4.0)
MCH: 25.2 pg — ABNORMAL LOW (ref 26.0–34.0)
MCHC: 30.7 g/dL (ref 30.0–36.0)
MCV: 82.1 fL (ref 80.0–100.0)
Monocytes Absolute: 0.7 10*3/uL (ref 0.1–1.0)
Monocytes Relative: 13 %
Neutro Abs: 2.8 10*3/uL (ref 1.7–7.7)
Neutrophils Relative %: 54 %
Platelets: 350 10*3/uL (ref 150–400)
RBC: 4.36 MIL/uL (ref 3.87–5.11)
RDW: 13.8 % (ref 11.5–15.5)
WBC: 5.3 10*3/uL (ref 4.0–10.5)
nRBC: 0 % (ref 0.0–0.2)

## 2022-10-20 LAB — URINALYSIS, ROUTINE W REFLEX MICROSCOPIC
Bilirubin Urine: NEGATIVE
Glucose, UA: NEGATIVE mg/dL
Hgb urine dipstick: NEGATIVE
Ketones, ur: NEGATIVE mg/dL
Leukocytes,Ua: NEGATIVE
Nitrite: NEGATIVE
Protein, ur: NEGATIVE mg/dL
Specific Gravity, Urine: 1.024 (ref 1.005–1.030)
pH: 6.5 (ref 5.0–8.0)

## 2022-10-20 LAB — LIPASE, BLOOD: Lipase: 41 U/L (ref 11–51)

## 2022-10-20 LAB — COMPREHENSIVE METABOLIC PANEL
ALT: 15 U/L (ref 0–44)
AST: 16 U/L (ref 15–41)
Albumin: 4 g/dL (ref 3.5–5.0)
Alkaline Phosphatase: 52 U/L (ref 38–126)
Anion gap: 9 (ref 5–15)
BUN: 12 mg/dL (ref 6–20)
CO2: 25 mmol/L (ref 22–32)
Calcium: 9.4 mg/dL (ref 8.9–10.3)
Chloride: 101 mmol/L (ref 98–111)
Creatinine, Ser: 0.71 mg/dL (ref 0.44–1.00)
GFR, Estimated: >60 mL/min (ref >60)
Glucose, Bld: 88 mg/dL (ref 70–99)
Potassium: 4 mmol/L (ref 3.5–5.1)
Sodium: 135 mmol/L (ref 135–145)
Total Bilirubin: 0.4 mg/dL (ref 0.3–1.2)
Total Protein: 8.4 g/dL — ABNORMAL HIGH (ref 6.5–8.1)

## 2022-10-20 LAB — PREGNANCY, URINE: Preg Test, Ur: NEGATIVE

## 2022-10-20 MED ORDER — ONDANSETRON 4 MG PO TBDP
4.0000 mg | ORAL_TABLET | Freq: Three times a day (TID) | ORAL | 0 refills | Status: AC | PRN
Start: 2022-10-20 — End: ?

## 2022-10-20 MED ORDER — KETOROLAC TROMETHAMINE 30 MG/ML IJ SOLN
30.0000 mg | Freq: Once | INTRAMUSCULAR | Status: AC
  Administered 2022-10-20: 30 mg via INTRAVENOUS
  Filled 2022-10-20: qty 1

## 2022-10-20 MED ORDER — ONDANSETRON 4 MG PO TBDP: 4.0000 mg | ORAL_TABLET | Freq: Three times a day (TID) | ORAL | 0 refills | Status: DC | PRN

## 2022-10-20 MED ORDER — ALUM & MAG HYDROXIDE-SIMETH 200-200-20 MG/5ML PO SUSP
30.0000 mL | Freq: Once | ORAL | Status: AC
  Administered 2022-10-20: 30 mL via ORAL
  Filled 2022-10-20: qty 30

## 2022-10-20 MED ORDER — ONDANSETRON HCL 4 MG/2ML IJ SOLN
4.0000 mg | Freq: Once | INTRAMUSCULAR | Status: AC
  Administered 2022-10-20: 4 mg via INTRAVENOUS
  Filled 2022-10-20: qty 2

## 2022-10-20 MED ORDER — MORPHINE SULFATE (PF) 4 MG/ML IV SOLN
4.0000 mg | Freq: Once | INTRAVENOUS | Status: DC
  Filled 2022-10-20: qty 1

## 2022-10-20 MED ORDER — MORPHINE SULFATE (PF) 4 MG/ML IV SOLN
4.0000 mg | Freq: Once | INTRAVENOUS | Status: AC
  Administered 2022-10-20: 4 mg via INTRAVENOUS
  Filled 2022-10-20: qty 1

## 2022-10-20 MED ORDER — OXYCODONE-ACETAMINOPHEN 5-325 MG PO TABS: 1.0000 | ORAL_TABLET | Freq: Four times a day (QID) | ORAL | 0 refills | Status: DC | PRN

## 2022-10-20 MED ORDER — LIDOCAINE VISCOUS HCL 2 % MT SOLN
15.0000 mL | Freq: Once | OROMUCOSAL | Status: AC
  Administered 2022-10-20: 15 mL via ORAL
  Filled 2022-10-20: qty 15

## 2022-10-20 MED ORDER — SODIUM CHLORIDE 0.9 % IV BOLUS
1000.0000 mL | Freq: Once | INTRAVENOUS | Status: AC
  Administered 2022-10-20: 1000 mL via INTRAVENOUS

## 2022-10-20 NOTE — ED Notes (Signed)
Discharge instructions, follow up care, pain management and prescriptions reviewed and explained, pt verbalized understanding. Pt had no further questions on d/c. Pt caox4 and ambulatory on d/c.

## 2022-10-20 NOTE — ED Notes (Signed)
Holding the Toradol until the Urine Preg results.

## 2022-10-20 NOTE — ED Provider Notes (Signed)
[facility] EMERGENCY DEPT Provider Note   CSN: [id number] Arrival date & time: 10/20/22  0709     History  Chief Complaint  Patient presents with   Nausea   Diarrhea    Emily Morgan is a 31 y.o. female past medical history significant for PCOS, cholelithiasis here for evaluation of abdominal pain.  Has had nausea and loose stool which began 3 days ago.  Pain located epigastric region with burning into her chest as well as pain to her right upper abdomen.  Diagnosed with cholelithiasis a few months ago however was unable to have gallbladder removed at that time.  She has had some nausea and vomiting.  No fever, bright red blood in her stool, dysuria or hematuria.  Has some chronic back pain as well as darker stools due to taking iron supplementation. Pain worse with PO intake.  No pain or swelling to lower extremities.  No history of PE or DVT. Works at [employer] office so possible sick contacts.  HPI     Home Medications Prior to Admission medications   Medication Sig Start Date End Date Taking? Authorizing Provider  ondansetron (ZOFRAN-ODT) 4 MG disintegrating tablet Take 1 tablet (4 mg total) by mouth every 8 (eight) hours as needed for nausea or vomiting. 10/20/22  Yes ,  A, PA-C  oxyCODONE-acetaminophen (PERCOCET/ROXICET) 5-325 MG tablet Take 1 tablet by mouth every 6 (six) hours as needed for severe pain. 10/20/22  Yes ,  A, PA-C  metFORMIN (GLUCOPHAGE-XR) 500 MG 24 hr tablet Take 500 mg by mouth daily. 07/20/22   [provider]  VIENVA 0.1-20 MG-MCG tablet Take 1 tablet by mouth daily. 05/09/22   [provider]  Vitamin D, Ergocalciferol, (DRISDOL) 1.25 MG (50000 UNIT) CAPS capsule Take 50,000 Units by mouth once a week. 04/28/22   [provider]      Allergies    Bee venom, Cat hair extract, and Shellfish allergy    Review of Systems   Review of Systems  Constitutional: Negative.   HENT: Negative.     Respiratory: Negative.    Cardiovascular:        Epigastric into chest  Gastrointestinal:  Positive for abdominal pain, diarrhea and nausea. Negative for abdominal distention, anal bleeding, blood in stool, constipation, rectal pain and vomiting.  Genitourinary: Negative.   Musculoskeletal: Negative.   Skin: Negative.   Neurological: Negative.   All other systems reviewed and are negative.   Physical Exam Updated Vital Signs BP 120/62   Pulse 70   Temp (!) 97.5 F (36.4 C) (Oral)   Resp 19   Ht 5\' 6"  (1.676 m)   Wt 122.5 kg   SpO2 100%   BMI 43.58 kg/m  Physical Exam Vitals and nursing note reviewed.  Constitutional:      General: She is not in acute distress.    Appearance: She is well-developed. She is not ill-appearing, toxic-appearing or diaphoretic.  HENT:     Head: Normocephalic and atraumatic.  Eyes:     Pupils: Pupils are equal, round, and reactive to light.  Cardiovascular:     Rate and Rhythm: Normal rate.     Pulses: Normal pulses.     Heart sounds: Normal heart sounds.  Pulmonary:     Effort: Pulmonary effort is normal. No respiratory distress.     Breath sounds: Normal breath sounds.  Abdominal:     General: Bowel sounds are normal. There is no distension.     Palpations: Abdomen is soft.  Tenderness: There is abdominal tenderness in the right upper quadrant and epigastric area. There is no right CVA tenderness, left CVA tenderness, guarding or rebound. Negative signs include Murphy's sign and McBurney's sign.     Hernia: No hernia is present.    Musculoskeletal:        General: Normal range of motion.     Cervical back: Normal range of motion.     Comments: Moves all 4 extremities without difficulty  Skin:    General: Skin is warm and dry.  Neurological:     General: No focal deficit present.     Mental Status: She is alert.  Psychiatric:        Mood and Affect: Mood normal.     ED Results / Procedures / Treatments   Labs (all labs  ordered are listed, but only abnormal results are displayed) Labs Reviewed  CBC WITH DIFFERENTIAL/PLATELET - Abnormal; Notable for the following components:      Result Value   Hemoglobin 11.0 (*)    HCT 35.8 (*)    MCH 25.2 (*)    All other components within normal limits  COMPREHENSIVE METABOLIC PANEL - Abnormal; Notable for the following components:   Total Protein 8.4 (*)    All other components within normal limits  LIPASE, BLOOD  URINALYSIS, ROUTINE W REFLEX MICROSCOPIC  PREGNANCY, URINE    EKG EKG Interpretation  Date/Time:  Thursday October 20 2022 08:25:24 EST Ventricular Rate:  86 PR Interval:  212 QRS Duration: 96 QT Interval:  365 QTC Calculation: 437 R Axis:   17 Text Interpretation: Sinus rhythm Prolonged PR interval Left atrial enlargement Low voltage, precordial leads since last tracing no significant change Confirmed by Rolan Bucco [phone]) on 10/20/2022 8:34:23 AM  Radiology US Abdomen Limited RUQ (LIVER/GB)  Result Date: 10/20/2022 CLINICAL DATA:  Epigastric pain right upper quadrant pain. EXAM: ULTRASOUND ABDOMEN LIMITED RIGHT UPPER QUADRANT COMPARISON:  Ultrasound right upper quadrant 06/28/2022 FINDINGS: Gallbladder: Multiple gallstones measuring up to 12 mm in diameter. Negative sonographic Murphy sign. Negative for gallbladder wall thickening Common bile duct: Diameter: 6.1 mm Liver: No focal lesion identified. Within normal limits in parenchymal echogenicity. Portal vein is patent on color Doppler imaging with normal direction of blood flow towards the liver. Other: None. IMPRESSION: Gallstones without evidence for acute cholecystitis. Common bile duct is upper limits of normal. Electronically Signed   By: Marlan Palau M.D.   On: 10/20/2022 08:09    Procedures Procedures    Medications Ordered in ED Medications  ondansetron (ZOFRAN) injection 4 mg (4 mg Intravenous Given 10/20/22 0819)  sodium chloride 0.9 % bolus 1,000 mL (0 mLs Intravenous  Stopped 10/20/22 0933)  alum & mag hydroxide-simeth (MAALOX/MYLANTA) 200-200-20 MG/5ML suspension 30 mL (30 mLs Oral Given 10/20/22 0826)    And  lidocaine (XYLOCAINE) 2 % viscous mouth solution 15 mL (15 mLs Oral Given 10/20/22 0826)  ketorolac (TORADOL) 30 MG/ML injection 30 mg (30 mg Intravenous Given 10/20/22 0855)  morphine (PF) 4 MG/ML injection 4 mg (4 mg Intravenous Given 10/20/22 9169)   ED Course/ Medical Decision Making/ A&P     31 year old known history of PCOS, cholelithiasis here for evaluation abdominal pain, loose stool and nausea which began 3 days PTA.  Pain located epigastric region and right upper quadrant, radiates into chest.  No clinical evidence of VTE on exam.  Pain worse with food intake.  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  Will plan  on labs, imaging, IV fluids, pain control and reassess  Labs and imaging personally viewed and interpreted:  CBC leukocytosis, hemoglobin 11.0 CMP no significant abnormality, specifically normal LFTs Lipase 41 Preg neg UA neg for infection [place] with cholelithiasis without cholecystitis. EKG without ischemic changes  Patient reassessed. Declined morphine, perc. Will give toradol  Patient reassessed. Pain improved however still has some pain. Will give some morphine which she is agreeable to now.  Patient reassessed.  Discussed labs, imaging. Declines further pain management. She is hungry wanting to try PO. I feel this is reasonable will attempt PO challenge. If able to tolerate, will DC home with gener surgery FU outpatient.  Patient reassessed.  Tolerating p.o. intake.  Pain improved.  Will DC home with symptomatic management, encouraged her to follow-up closely with general surgery, return for new or worsening symptoms.  The patient has been appropriately medically screened and/or stabilized in the ED. I have low suspicion for any other emergent medical condition which would require  further screening, evaluation or treatment in the ED or require inpatient management.  Patient is hemodynamically stable and in no acute distress.  Patient able to ambulate in department prior to ED.  Evaluation does not show acute pathology that would require ongoing or additional emergent interventions while in the emergency department or further inpatient treatment.  I have discussed the diagnosis with the patient and answered all questions.  Pain is been managed while in the emergency department and patient has no further complaints prior to discharge.  Patient is comfortable with plan discussed in room and is stable for discharge at this time.  I have discussed strict return precautions for returning to the emergency department.  Patient was encouraged to follow-up with PCP/specialist refer to at discharge.                            Medical Decision Making Amount and/or Complexity of Data Reviewed External Data Reviewed: labs, radiology and notes. Labs: ordered. Decision-making details documented in ED Course. Radiology: ordered and independent interpretation performed. Decision-making details documented in ED Course. ECG/medicine tests: ordered and independent interpretation performed. Decision-making details documented in ED Course.  Risk OTC drugs. Prescription drug management. Parenteral controlled substances. Decision regarding hospitalization. Diagnosis or treatment significantly limited by social determinants of health.          Final Clinical Impression(s) / ED Diagnoses Final diagnoses:  Symptomatic cholelithiasis    Rx / DC Orders ED Discharge Orders          Ordered    oxyCODONE-acetaminophen (PERCOCET/ROXICET) 5-325 MG tablet  Every 6 hours PRN        10/20/22 1230    ondansetron (ZOFRAN-ODT) 4 MG disintegrating tablet  Every 8 hours PRN        10/20/22 1230              ,  A, PA-C 10/20/22 1233    Rolan Bucco, MD 10/20/22  1452

## 2022-10-20 NOTE — ED Triage Notes (Signed)
Pt arrives to ED with c/o nausea, diarrhea, epigastric/RUQ abdominal pain that started 10/18.

## 2022-10-20 NOTE — Discharge Instructions (Addendum)
Your symptoms are likely due to your gallstones.  I have written you for a short course of pain medicine as well as nausea medicine.  If your symptoms continue to worsen please follow-up in the emergency department otherwise follow-up with the general surgeons that you saw previously for this issue.

## 2022-12-02 ENCOUNTER — Emergency Department (HOSPITAL_COMMUNITY): Payer: No Typology Code available for payment source | Admitting: Anesthesiology

## 2022-12-02 ENCOUNTER — Encounter (HOSPITAL_COMMUNITY): Payer: Self-pay | Admitting: *Deleted

## 2022-12-02 ENCOUNTER — Emergency Department (HOSPITAL_COMMUNITY): Payer: No Typology Code available for payment source

## 2022-12-02 ENCOUNTER — Other Ambulatory Visit: Payer: Self-pay

## 2022-12-02 ENCOUNTER — Encounter (HOSPITAL_COMMUNITY)
Admission: EM | Disposition: A | Discharge status: Home / Self Care | Payer: Self-pay | Source: Home / Self Care | Attending: Emergency Medicine

## 2022-12-02 ENCOUNTER — Ambulatory Visit (HOSPITAL_COMMUNITY)
Admission: EM | Admit: 2022-12-02 | Discharge: 2022-12-02 | Disposition: A | Discharge status: Home / Self Care | Payer: No Typology Code available for payment source | Attending: Emergency Medicine | Admitting: Emergency Medicine

## 2022-12-02 ENCOUNTER — Emergency Department (HOSPITAL_BASED_OUTPATIENT_CLINIC_OR_DEPARTMENT_OTHER): Payer: No Typology Code available for payment source | Admitting: Anesthesiology

## 2022-12-02 DIAGNOSIS — Z6841 Body Mass Index (BMI) 40.0 and over, adult: Secondary | ICD-10-CM | POA: Diagnosis not present

## 2022-12-02 DIAGNOSIS — K8000 Calculus of gallbladder with acute cholecystitis without obstruction: Secondary | ICD-10-CM | POA: Diagnosis not present

## 2022-12-02 DIAGNOSIS — Z7984 Long term (current) use of oral hypoglycemic drugs: Secondary | ICD-10-CM | POA: Insufficient documentation

## 2022-12-02 DIAGNOSIS — E119 Type 2 diabetes mellitus without complications: Secondary | ICD-10-CM | POA: Diagnosis not present

## 2022-12-02 DIAGNOSIS — K760 Fatty (change of) liver, not elsewhere classified: Secondary | ICD-10-CM | POA: Diagnosis not present

## 2022-12-02 DIAGNOSIS — K8010 Calculus of gallbladder with chronic cholecystitis without obstruction: Secondary | ICD-10-CM | POA: Diagnosis not present

## 2022-12-02 DIAGNOSIS — K8020 Calculus of gallbladder without cholecystitis without obstruction: Secondary | ICD-10-CM

## 2022-12-02 DIAGNOSIS — E282 Polycystic ovarian syndrome: Secondary | ICD-10-CM | POA: Diagnosis not present

## 2022-12-02 DIAGNOSIS — R112 Nausea with vomiting, unspecified: Secondary | ICD-10-CM

## 2022-12-02 DIAGNOSIS — M1990 Unspecified osteoarthritis, unspecified site: Secondary | ICD-10-CM | POA: Diagnosis not present

## 2022-12-02 HISTORY — PX: CHOLECYSTECTOMY: SHX55

## 2022-12-02 LAB — COMPREHENSIVE METABOLIC PANEL
ALT: 32 U/L (ref 0–44)
AST: 26 U/L (ref 15–41)
Albumin: 4.2 g/dL (ref 3.5–5.0)
Alkaline Phosphatase: 52 U/L (ref 38–126)
Anion gap: 8 (ref 5–15)
BUN: 7 mg/dL (ref 6–20)
CO2: 24 mmol/L (ref 22–32)
Calcium: 9.4 mg/dL (ref 8.9–10.3)
Chloride: 103 mmol/L (ref 98–111)
Creatinine, Ser: 0.77 mg/dL (ref 0.44–1.00)
GFR, Estimated: >60 mL/min (ref >60)
Glucose, Bld: 105 mg/dL — ABNORMAL HIGH (ref 70–99)
Potassium: 3.9 mmol/L (ref 3.5–5.1)
Sodium: 135 mmol/L (ref 135–145)
Total Bilirubin: 0.4 mg/dL (ref 0.3–1.2)
Total Protein: 9.7 g/dL — ABNORMAL HIGH (ref 6.5–8.1)

## 2022-12-02 LAB — CBC WITH DIFFERENTIAL/PLATELET
Abs Immature Granulocytes: 0.01 10*3/uL (ref 0.00–0.07)
Basophils Absolute: 0 10*3/uL (ref 0.0–0.1)
Basophils Relative: 0 %
Eosinophils Absolute: 0 10*3/uL (ref 0.0–0.5)
Eosinophils Relative: 1 %
HCT: 37.7 % (ref 36.0–46.0)
Hemoglobin: 11.5 g/dL — ABNORMAL LOW (ref 12.0–15.0)
Immature Granulocytes: 0 %
Lymphocytes Relative: 26 %
Lymphs Abs: 1.2 10*3/uL (ref 0.7–4.0)
MCH: 25.3 pg — ABNORMAL LOW (ref 26.0–34.0)
MCHC: 30.5 g/dL (ref 30.0–36.0)
MCV: 83 fL (ref 80.0–100.0)
Monocytes Absolute: 0.4 10*3/uL (ref 0.1–1.0)
Monocytes Relative: 10 %
Neutro Abs: 2.8 10*3/uL (ref 1.7–7.7)
Neutrophils Relative %: 63 %
Platelets: 348 10*3/uL (ref 150–400)
RBC: 4.54 MIL/uL (ref 3.87–5.11)
RDW: 14 % (ref 11.5–15.5)
WBC: 4.4 10*3/uL (ref 4.0–10.5)
nRBC: 0 % (ref 0.0–0.2)

## 2022-12-02 LAB — URINALYSIS, ROUTINE W REFLEX MICROSCOPIC
Bilirubin Urine: NEGATIVE
Glucose, UA: NEGATIVE mg/dL
Ketones, ur: NEGATIVE mg/dL
Nitrite: NEGATIVE
Protein, ur: NEGATIVE mg/dL
RBC / HPF: >50 RBC/hpf (ref 0–5)
Specific Gravity, Urine: 1.013 (ref 1.005–1.030)
pH: 7 (ref 5.0–8.0)

## 2022-12-02 LAB — HCG, QUANTITATIVE, PREGNANCY: hCG, Beta Chain, Quant, S: <1 m[IU]/mL (ref <5)

## 2022-12-02 LAB — LIPASE, BLOOD: Lipase: 40 U/L (ref 11–51)

## 2022-12-02 SURGERY — LAPAROSCOPIC CHOLECYSTECTOMY
Anesthesia: General

## 2022-12-02 MED ORDER — FENTANYL CITRATE (PF) 100 MCG/2ML IJ SOLN
INTRAMUSCULAR | Status: AC
  Filled 2022-12-02: qty 2

## 2022-12-02 MED ORDER — FAMOTIDINE IN NACL 20-0.9 MG/50ML-% IV SOLN
20.0000 mg | Freq: Once | INTRAVENOUS | Status: AC
  Administered 2022-12-02: 20 mg via INTRAVENOUS
  Filled 2022-12-02: qty 50

## 2022-12-02 MED ORDER — ACETAMINOPHEN 500 MG PO TABS
1000.0000 mg | ORAL_TABLET | Freq: Once | ORAL | Status: AC
  Administered 2022-12-02: 1000 mg via ORAL
  Filled 2022-12-02: qty 2

## 2022-12-02 MED ORDER — OXYCODONE HCL 5 MG PO TABS: 5.0000 mg | ORAL_TABLET | Freq: Four times a day (QID) | ORAL | 0 refills | Status: DC | PRN

## 2022-12-02 MED ORDER — ROCURONIUM BROMIDE 10 MG/ML (PF) SYRINGE
PREFILLED_SYRINGE | INTRAVENOUS | Status: DC | PRN
  Administered 2022-12-02: 50 mg via INTRAVENOUS

## 2022-12-02 MED ORDER — MIDAZOLAM HCL 2 MG/2ML IJ SOLN
INTRAMUSCULAR | Status: AC
  Filled 2022-12-02: qty 2

## 2022-12-02 MED ORDER — DEXAMETHASONE SODIUM PHOSPHATE 10 MG/ML IJ SOLN
INTRAMUSCULAR | Status: DC | PRN
  Administered 2022-12-02: 10 mg via INTRAVENOUS

## 2022-12-02 MED ORDER — 0.9 % SODIUM CHLORIDE (POUR BTL) OPTIME
TOPICAL | Status: DC | PRN
  Administered 2022-12-02: 1000 mL

## 2022-12-02 MED ORDER — MIDAZOLAM HCL 5 MG/5ML IJ SOLN
INTRAMUSCULAR | Status: DC | PRN
  Administered 2022-12-02: 2 mg via INTRAVENOUS

## 2022-12-02 MED ORDER — PROPOFOL 10 MG/ML IV BOLUS
INTRAVENOUS | Status: AC
  Filled 2022-12-02: qty 20

## 2022-12-02 MED ORDER — ACETAMINOPHEN 500 MG PO TABS: 1000.0000 mg | ORAL_TABLET | Freq: Three times a day (TID) | ORAL | 0 refills | Status: AC | PRN

## 2022-12-02 MED ORDER — LIDOCAINE HCL (PF) 2 % IJ SOLN
INTRAMUSCULAR | Status: AC
  Filled 2022-12-02: qty 5

## 2022-12-02 MED ORDER — KETOROLAC TROMETHAMINE 30 MG/ML IJ SOLN
INTRAMUSCULAR | Status: DC | PRN
  Administered 2022-12-02: 30 mg via INTRAVENOUS

## 2022-12-02 MED ORDER — MORPHINE SULFATE (PF) 4 MG/ML IV SOLN
4.0000 mg | Freq: Once | INTRAVENOUS | Status: AC
  Administered 2022-12-02: 4 mg via INTRAVENOUS
  Filled 2022-12-02: qty 1

## 2022-12-02 MED ORDER — BUPIVACAINE HCL (PF) 0.5 % IJ SOLN
INTRAMUSCULAR | Status: DC | PRN
  Administered 2022-12-02: 30 mL

## 2022-12-02 MED ORDER — LACTATED RINGERS IR SOLN
Status: DC | PRN
  Administered 2022-12-02: 1000 mL

## 2022-12-02 MED ORDER — ONDANSETRON HCL 4 MG/2ML IJ SOLN
INTRAMUSCULAR | Status: DC | PRN
  Administered 2022-12-02: 4 mg via INTRAVENOUS

## 2022-12-02 MED ORDER — BUPIVACAINE HCL (PF) 0.5 % IJ SOLN
INTRAMUSCULAR | Status: AC
  Filled 2022-12-02: qty 30

## 2022-12-02 MED ORDER — FENTANYL CITRATE PF 50 MCG/ML IJ SOSY
PREFILLED_SYRINGE | INTRAMUSCULAR | Status: AC
  Filled 2022-12-02: qty 1

## 2022-12-02 MED ORDER — FENTANYL CITRATE (PF) 100 MCG/2ML IJ SOLN
INTRAMUSCULAR | Status: DC | PRN
  Administered 2022-12-02: 50 ug via INTRAVENOUS
  Administered 2022-12-02: 100 ug via INTRAVENOUS
  Administered 2022-12-02: 50 ug via INTRAVENOUS

## 2022-12-02 MED ORDER — FENTANYL CITRATE PF 50 MCG/ML IJ SOSY
PREFILLED_SYRINGE | INTRAMUSCULAR | Status: AC
  Filled 2022-12-02: qty 2

## 2022-12-02 MED ORDER — ORAL CARE MOUTH RINSE: 15.0000 mL | Freq: Once | OROMUCOSAL | Status: AC

## 2022-12-02 MED ORDER — POLYETHYLENE GLYCOL 3350 17 G PO PACK: 17.0000 g | PACK | Freq: Every day | ORAL | 0 refills | Status: AC | PRN

## 2022-12-02 MED ORDER — SUGAMMADEX SODIUM 200 MG/2ML IV SOLN
INTRAVENOUS | Status: DC | PRN
  Administered 2022-12-02: 200 mg via INTRAVENOUS

## 2022-12-02 MED ORDER — PROPOFOL 10 MG/ML IV BOLUS
INTRAVENOUS | Status: DC | PRN
  Administered 2022-12-02: 200 mg via INTRAVENOUS

## 2022-12-02 MED ORDER — FENTANYL CITRATE PF 50 MCG/ML IJ SOSY
25.0000 ug | PREFILLED_SYRINGE | INTRAMUSCULAR | Status: DC | PRN
  Administered 2022-12-02 (×3): 50 ug via INTRAVENOUS

## 2022-12-02 MED ORDER — LACTATED RINGERS IV BOLUS
1000.0000 mL | Freq: Once | INTRAVENOUS | Status: AC
  Administered 2022-12-02: 1000 mL via INTRAVENOUS

## 2022-12-02 MED ORDER — ONDANSETRON HCL 4 MG/2ML IJ SOLN
4.0000 mg | Freq: Once | INTRAMUSCULAR | Status: AC
  Administered 2022-12-02: 4 mg via INTRAVENOUS
  Filled 2022-12-02: qty 2

## 2022-12-02 MED ORDER — SODIUM CHLORIDE 0.9 % IV SOLN
2.0000 g | INTRAVENOUS | Status: AC
  Administered 2022-12-02: 2 g via INTRAVENOUS

## 2022-12-02 MED ORDER — AMISULPRIDE (ANTIEMETIC) 5 MG/2ML IV SOLN: 10.0000 mg | Freq: Once | INTRAVENOUS | Status: DC | PRN

## 2022-12-02 MED ORDER — PHENYLEPHRINE 80 MCG/ML (10ML) SYRINGE FOR IV PUSH (FOR BLOOD PRESSURE SUPPORT)
PREFILLED_SYRINGE | INTRAVENOUS | Status: DC | PRN
  Administered 2022-12-02: 160 ug via INTRAVENOUS

## 2022-12-02 MED ORDER — LACTATED RINGERS IV SOLN: INTRAVENOUS | Status: DC

## 2022-12-02 MED ORDER — LIDOCAINE 2% (20 MG/ML) 5 ML SYRINGE
INTRAMUSCULAR | Status: DC | PRN
  Administered 2022-12-02: 100 mg via INTRAVENOUS

## 2022-12-02 MED ORDER — CHLORHEXIDINE GLUCONATE 0.12 % MT SOLN
15.0000 mL | Freq: Once | OROMUCOSAL | Status: AC
  Administered 2022-12-02: 15 mL via OROMUCOSAL

## 2022-12-02 MED ORDER — ROCURONIUM BROMIDE 10 MG/ML (PF) SYRINGE
PREFILLED_SYRINGE | INTRAVENOUS | Status: AC
  Filled 2022-12-02: qty 10

## 2022-12-02 SURGICAL SUPPLY — 42 items
APPLIER CLIP 5 13 M/L LIGAMAX5 (MISCELLANEOUS) ×1
APPLIER CLIP ROT 10 11.4 M/L (STAPLE)
BAG COUNTER SPONGE SURGICOUNT (BAG) IMPLANT
BENZOIN TINCTURE PRP APPL 2/3 (GAUZE/BANDAGES/DRESSINGS) IMPLANT
BNDG ADH 1X3 SHEER STRL LF (GAUZE/BANDAGES/DRESSINGS) IMPLANT
CABLE HIGH FREQUENCY MONO STRZ (ELECTRODE) ×1 IMPLANT
CHLORAPREP W/TINT 26 (MISCELLANEOUS) ×1 IMPLANT
CLIP APPLIE 5 13 M/L LIGAMAX5 (MISCELLANEOUS) ×1 IMPLANT
CLIP APPLIE ROT 10 11.4 M/L (STAPLE) IMPLANT
CLIP LIGATING HEM O LOK PURPLE (MISCELLANEOUS) IMPLANT
CLIP LIGATING HEMO O LOK GREEN (MISCELLANEOUS) ×1 IMPLANT
COVER MAYO STAND XLG (MISCELLANEOUS) ×1 IMPLANT
COVER SURGICAL LIGHT HANDLE (MISCELLANEOUS) ×1 IMPLANT
DERMABOND ADVANCED .7 DNX12 (GAUZE/BANDAGES/DRESSINGS) IMPLANT
DRAIN CHANNEL 19F RND (DRAIN) IMPLANT
DRAPE C-ARM 42X120 X-RAY (DRAPES) IMPLANT
EVACUATOR SILICONE 100CC (DRAIN) IMPLANT
GLOVE BIOGEL PI IND STRL 7.0 (GLOVE) ×1 IMPLANT
GLOVE SURG SS PI 7.0 STRL IVOR (GLOVE) ×1 IMPLANT
GOWN STRL REUS W/ TWL LRG LVL3 (GOWN DISPOSABLE) ×1 IMPLANT
GOWN STRL REUS W/TWL LRG LVL3 (GOWN DISPOSABLE) ×1
GRASPER SUT TROCAR 14GX15 (MISCELLANEOUS) ×1 IMPLANT
IRRIG SUCT STRYKERFLOW 2 WTIP (MISCELLANEOUS) ×1
IRRIGATION SUCT STRKRFLW 2 WTP (MISCELLANEOUS) ×1 IMPLANT
KIT BASIN OR (CUSTOM PROCEDURE TRAY) ×1 IMPLANT
KIT TURNOVER KIT A (KITS) IMPLANT
POUCH RETRIEVAL ECOSAC 10 (ENDOMECHANICALS) ×1 IMPLANT
POUCH RETRIEVAL ECOSAC 10MM (ENDOMECHANICALS) ×1
SCISSORS LAP 5X35 DISP (ENDOMECHANICALS) ×1 IMPLANT
SET CHOLANGIOGRAPH MIX (MISCELLANEOUS) IMPLANT
SET TUBE SMOKE EVAC HIGH FLOW (TUBING) ×1 IMPLANT
SLEEVE Z-THREAD 5X100MM (TROCAR) ×2 IMPLANT
SPIKE FLUID TRANSFER (MISCELLANEOUS) ×1 IMPLANT
STRIP CLOSURE SKIN 1/2X4 (GAUZE/BANDAGES/DRESSINGS) IMPLANT
SUT ETHILON 2 0 PS N (SUTURE) IMPLANT
SUT MNCRL AB 4-0 PS2 18 (SUTURE) ×1 IMPLANT
SUT VICRYL 0 ENDOLOOP (SUTURE) IMPLANT
TOWEL OR 17X26 10 PK STRL BLUE (TOWEL DISPOSABLE) ×1 IMPLANT
TOWEL OR NON WOVEN STRL DISP B (DISPOSABLE) IMPLANT
TRAY LAPAROSCOPIC (CUSTOM PROCEDURE TRAY) ×1 IMPLANT
TROCAR ADV FIXATION 12X100MM (TROCAR) ×1 IMPLANT
TROCAR Z-THREAD OPTICAL 5X100M (TROCAR) ×1 IMPLANT

## 2022-12-02 NOTE — Discharge Instructions (Signed)
[facility]  Please arrive at least 30 min before your appointment to complete your check in paperwork.  If you are unable to arrive 30 min prior to your appointment time we may have to cancel or reschedule you. LAPAROSCOPIC SURGERY: POST OP INSTRUCTIONS Always review your discharge instruction sheet given to you by the facility where your surgery was performed. IF YOU HAVE DISABILITY OR FAMILY LEAVE FORMS, YOU MUST BRING THEM TO THE OFFICE FOR PROCESSING.   DO NOT GIVE THEM TO YOUR DOCTOR.  PAIN CONTROL  First take acetaminophen (Tylenol) AND/or ibuprofen (Advil) to control your pain after surgery.  Follow directions on package.  Taking acetaminophen (Tylenol) and/or ibuprofen (Advil) regularly after surgery will help to control your pain and lower the amount of prescription pain medication you may need.  You should not take more than 4,000 mg (4 grams) of acetaminophen (Tylenol) in 24 hours.  You should not take ibuprofen (Advil), aleve, motrin, naprosyn or other NSAIDS if you have a history of stomach ulcers or chronic kidney disease.  A prescription for pain medication may be given to you upon discharge.  Take your pain medication as prescribed, if you still have uncontrolled pain after taking acetaminophen (Tylenol) or ibuprofen (Advil). Use ice packs to help control pain. If you need a refill on your pain medication, please contact your pharmacy.  They will contact our office to request authorization. Prescriptions will not be filled after 5pm or on week-ends.  HOME MEDICATIONS Take your usually prescribed medications unless otherwise directed.  DIET You should follow a light diet the first few days after arrival home.  Be sure to include lots of fluids daily. Avoid fatty, fried foods.   CONSTIPATION It is common to experience some constipation after surgery and if you are taking pain medication.  Increasing fluid intake and taking a stool softener (such as Colace)  will usually help or prevent this problem from occurring.  A mild laxative (Milk of Magnesia or Miralax) should be taken according to package instructions if there are no bowel movements after 48 hours.  WOUND/INCISION CARE Most patients will experience some swelling and bruising in the area of the incisions.  Ice packs will help.  Swelling and bruising can take several days to resolve.  Unless discharge instructions indicate otherwise, follow guidelines below  STERI-STRIPS - you may remove your outer bandages 48 hours after surgery, and you may shower at that time.  You have steri-strips (small skin tapes) in place directly over the incision.  These strips should be left on the skin for 7-10 days.   DERMABOND/SKIN GLUE - you may shower in 24 hours.  The glue will flake off over the next 2-3 weeks. Any sutures or staples will be removed at the office during your follow-up visit.  ACTIVITIES You may resume regular (light) daily activities beginning the next day--such as daily self-care, walking, climbing stairs--gradually increasing activities as tolerated.  You may have sexual intercourse when it is comfortable.  Refrain from any heavy lifting or straining until approved by your doctor. You may drive when you are no longer taking prescription pain medication, you can comfortably wear a seatbelt, and you can safely maneuver your car and apply brakes.  FOLLOW-UP You should see your doctor in the office for a follow-up appointment approximately 2-3 weeks after your surgery.  You should have been given your post-op/follow-up appointment when your surgery was scheduled.  If you did not receive a post-op/follow-up appointment, make sure   that you call for this appointment within a day or two after you arrive home to insure a convenient appointment time.   WHEN TO CALL YOUR DOCTOR: Fever over 101.0 Inability to urinate Continued bleeding from incision. Increased pain, redness, or drainage from the  incision. Increasing abdominal pain  The clinic staff is available to answer your questions during regular business hours.  Please don't hesitate to call and ask to speak to one of the nurses for clinical concerns.  If you have a medical emergency, go to the nearest emergency room or call 911.  A surgeon from [facility] is always on call at the hospital. [address], [address], [place], [place]  [address] ? [address] [address], [place], [place]   [address] [phone] ? [phone] ? FAX [phone]  

## 2022-12-02 NOTE — H&P (Signed)
Emily Morgan 07/02/1991  [id number].    Requesting MD: Dr. Georgina Snell Chief Complaint/Reason for Consult: Symptomatic Cholelithiasis   HPI: Emily Morgan is a 32 y.o. female with hx of gallstones who presented to the ED for abdominal pain. Patient is known to our group. Has seen Dr. Kieth Brightly in the office for symptomatic cholelithiasis on 06/29/22 and is planned for lap chole with him on 2/16. Returns to ED today with recurrent symptoms of epigastric/ruq pain and nausea. Pain medications have helped here but not completely resolved her pain. No fever, tachycardia or hypotension. WBC wnl. Lipase and LFT's non-elevated. RUQ [place] reviewed - Cholelithiasis w/o evidence of Acute Cholecystitis. Hx of C-Section. She is a CMA.   ROS: ROS As above, see hpi  Family History  Problem Relation Age of Onset   Hypertension Mother    Cancer Father     Past Medical History:  Diagnosis Date   Degenerative lumbar disc    PCOS (polycystic ovarian syndrome)     Past Surgical History:  Procedure Laterality Date   CESAREAN SECTION      Social History:  reports that she has never smoked. She has never used smokeless tobacco. She reports current alcohol use. She reports that she does not use drugs. Occasional alcohol use - last drink yesterday Vapes occasionally No drug use  Allergies:  Allergies  Allergen Reactions   Bee Venom Anaphylaxis, Swelling and Other (See Comments)   Cat Hair Extract Itching   Shellfish Allergy Other (See Comments)    (Not in a hospital admission)    Physical Exam: Blood pressure 137/73, pulse 80, temperature 97.8 F (36.6 C), temperature source Oral, resp. rate 18, weight 122 kg, SpO2 100 %. General: pleasant, WD/WN female who is laying in bed in NAD HEENT: head is normocephalic, atraumatic.  Sclera are noninjected.  PERRL.  Ears and nose without any masses or lesions.  Mouth is pink and moist. Dentition fair Heart: regular, rate, and rhythm.   Extremities are wwp Lungs: CTAB, no wheezes, rhonchi, or rales noted.  Respiratory effort nonlabored Abd:  Soft, ND, epigastric ttp without rigidity or guarding. Otherwise NT. +BS. No masses, hernias, or organomegaly MS: no BUE or BLE edema, calves soft and nontender Skin: warm and dry  Psych: A&Ox4 with an appropriate affect Neuro: cranial nerves grossly intact, normal speech, thought process intact, moves all extremities, gait not assessed   Results for orders placed or performed during the hospital encounter of 12/02/22 (from the past 48 hour(s))  Comprehensive metabolic panel     Status: Abnormal   Collection Time: 12/02/22  9:10 AM  Result Value Ref Range   Sodium 135 135 - 145 mmol/L   Potassium 3.9 3.5 - 5.1 mmol/L   Chloride 103 98 - 111 mmol/L   CO2 24 22 - 32 mmol/L   Glucose, Bld 105 (H) 70 - 99 mg/dL    Comment: Glucose reference range applies only to samples taken after fasting for at least 8 hours.   BUN 7 6 - 20 mg/dL   Creatinine, Ser 0.77 0.44 - 1.00 mg/dL   Calcium 9.4 8.9 - 10.3 mg/dL   Total Protein 9.7 (H) 6.5 - 8.1 g/dL   Albumin 4.2 3.5 - 5.0 g/dL   AST 26 15 - 41 U/L   ALT 32 0 - 44 U/L   Alkaline Phosphatase 52 38 - 126 U/L   Total Bilirubin 0.4 0.3 - 1.2 mg/dL   GFR, Estimated >60 >60 mL/min  Comment: (NOTE) Calculated using the CKD-EPI Creatinine Equation (2021)    Anion gap 8 5 - 15    Comment: Performed at [facility], [place] [address], [place], [place] [address]  Lipase, blood     Status: None   Collection Time: 12/02/22  9:10 AM  Result Value Ref Range   Lipase 40 11 - 51 U/L    Comment: Performed at [facility], [place] [address], [place], [place] [address]  CBC with Differential     Status: Abnormal   Collection Time: 12/02/22  9:10 AM  Result Value Ref Range   WBC 4.4 4.0 - 10.5 K/uL   RBC 4.54 3.87 - 5.11 MIL/uL   Hemoglobin 11.5 (L) 12.0 - 15.0 g/dL   HCT 37.7 36.0 - 46.0 %   MCV 83.0 80.0 - 100.0  fL   MCH 25.3 (L) 26.0 - 34.0 pg   MCHC 30.5 30.0 - 36.0 g/dL   RDW 14.0 11.5 - 15.5 %   Platelets 348 150 - 400 K/uL   nRBC 0.0 0.0 - 0.2 %   Neutrophils Relative % 63 %   Neutro Abs 2.8 1.7 - 7.7 K/uL   Lymphocytes Relative 26 %   Lymphs Abs 1.2 0.7 - 4.0 K/uL   Monocytes Relative 10 %   Monocytes Absolute 0.4 0.1 - 1.0 K/uL   Eosinophils Relative 1 %   Eosinophils Absolute 0.0 0.0 - 0.5 K/uL   Basophils Relative 0 %   Basophils Absolute 0.0 0.0 - 0.1 K/uL   Immature Granulocytes 0 %   Abs Immature Granulocytes 0.01 0.00 - 0.07 K/uL    Comment: Performed at [facility], [place] [address], [place], [place] [address]  hCG, quantitative, pregnancy     Status: None   Collection Time: 12/02/22  9:10 AM  Result Value Ref Range   hCG, Beta Chain, Quant, S <1 <5 mIU/mL    Comment:          GEST. AGE      CONC.  (mIU/mL)   <=1 WEEK        5 - 50     2 WEEKS       50 - 500     3 WEEKS       100 - 10,000     4 WEEKS     1,000 - 30,000     5 WEEKS     3,500 - 115,000   6-8 WEEKS     12,000 - 270,000    12 WEEKS     15,000 - 220,000        FEMALE AND NON-PREGNANT FEMALE:     LESS THAN 5 mIU/mL Performed at [facility], [place] [address], [place], [place] [address]   Urinalysis, Routine w reflex microscopic -Urine, Clean Catch     Status: Abnormal   Collection Time: 12/02/22 10:41 AM  Result Value Ref Range   Color, Urine YELLOW YELLOW   APPearance CLEAR CLEAR   Specific Gravity, Urine 1.013 1.005 - 1.030   pH 7.0 5.0 - 8.0   Glucose, UA NEGATIVE NEGATIVE mg/dL   Hgb urine dipstick LARGE (A) NEGATIVE   Bilirubin Urine NEGATIVE NEGATIVE   Ketones, ur NEGATIVE NEGATIVE mg/dL   Protein, ur NEGATIVE NEGATIVE mg/dL   Nitrite NEGATIVE NEGATIVE   Leukocytes,Ua TRACE (A) NEGATIVE   RBC / HPF >50 0 - 5 RBC/hpf   WBC, UA 0-5 0 - 5 WBC/hpf  Bacteria, UA RARE (A) NONE SEEN   Squamous Epithelial / HPF 0-5 0 - 5 /HPF   Mucus PRESENT     Comment:  Performed at [facility], [place] [address], [place],  [address]   US Abdomen Limited RUQ (LIVER/GB)  Result Date: 12/02/2022 CLINICAL DATA:  Cholelithiasis. EXAM: ULTRASOUND ABDOMEN LIMITED RIGHT UPPER QUADRANT COMPARISON:  Right upper quadrant ultrasound 10/20/2022. FINDINGS: Gallbladder: Multiple gallstones measuring up to 16 mm. No wall thickening or pericholecystic fluid. Negative sonographic Murphy's sign. Common bile duct: Diameter: 4.7 mm Liver: Hyperechoic liver, consistent with hepatic steatosis. Focal fatty sparing along the gallbladder fossa. Portal vein is patent on color Doppler imaging with normal direction of blood flow towards the liver. Other: None. IMPRESSION: 1. Cholelithiasis without sonographic evidence of acute cholecystitis. 2. Hepatic steatosis. Electronically Signed   By: Emmit Alexanders M.D.   On: 12/02/2022 09:51    Anti-infectives (From admission, onward)    None       Assessment/Plan Symptomatic Cholelithiasis  - No evidence of Acute Cholecystitis on labs/imaging but continued pain despite pain medication and has been been having more frequent attacks. She is known to our group and is planned for Lap Chole on 2/16 with Dr. Kieth Brightly. I saw with Dr. Kieth Brightly - recommend proceeding with OR for Lap Chole today. Patient agreeable. Plan d/c from PACU.   I reviewed nursing notes, ED provider notes, last 24 h vitals and pain scores, last 48 h intake and output, last 24 h labs and trends, and last 24 h imaging results.  Jillyn Ledger, [facility] 12/02/2022, 11:16 AM Please see Amion for pager number during day hours 7:00am-4:30pm

## 2022-12-02 NOTE — Anesthesia Procedure Notes (Signed)
Procedure Name: Intubation Date/Time: 12/02/2022 2:03 PM  Performed by: Gean Maidens, CRNAPre-anesthesia Checklist: Patient identified, Emergency Drugs available, Suction available, Patient being monitored and Timeout performed Patient Re-evaluated:Patient Re-evaluated prior to induction Oxygen Delivery Method: Circle system utilized Preoxygenation: Pre-oxygenation with 100% oxygen Induction Type: IV induction Ventilation: Mask ventilation without difficulty Laryngoscope Size: Mac and 4 Grade View: Grade I Tube type: Oral Tube size: 7.0 mm Number of attempts: 1 Airway Equipment and Method: Stylet Placement Confirmation: ETT inserted through vocal cords under direct vision, positive ETCO2 and breath sounds checked- equal and bilateral Secured at: 21 cm Tube secured with: Tape Dental Injury: Teeth and Oropharynx as per pre-operative assessment

## 2022-12-02 NOTE — Anesthesia Postprocedure Evaluation (Signed)
Anesthesia Post Note  Patient: Emily Morgan  Procedure(s) Performed: [place]     Patient location during evaluation: PACU Anesthesia Type: General Level of consciousness: awake and alert Pain management: pain level controlled Vital Signs Assessment: post-procedure vital signs reviewed and stable Respiratory status: spontaneous breathing, nonlabored ventilation, respiratory function stable and patient connected to nasal cannula oxygen Cardiovascular status: blood pressure returned to baseline and stable Postop Assessment: no apparent nausea or vomiting Anesthetic complications: no   No notable events documented.  Last Vitals:  Vitals:   12/02/22 1515 12/02/22 1530  BP: 135/85 129/81  Pulse: 87 94  Resp: 14 18  Temp:    SpO2: 100% 98%    Last Pain:  Vitals:   12/02/22 1530  TempSrc:   PainSc: [name]

## 2022-12-02 NOTE — Transfer of Care (Signed)
Immediate Anesthesia Transfer of Care Note  Patient: Emily Morgan  Procedure(s) Performed: LAPAROSCOPIC CHOLECYSTECTOMY  Patient Location: PACU  Anesthesia Type:General  Level of Consciousness: awake, alert , and oriented  Airway & Oxygen Therapy: Patient Spontanous Breathing and Patient connected to face mask oxygen  Post-op Assessment: Report given to RN and Post -op Vital signs reviewed and stable  Post vital signs: Reviewed and stable  Last Vitals:  Vitals Value Taken Time  BP 134/91 12/02/22 1509  Temp    Pulse 92 12/02/22 1510  Resp 17 12/02/22 1510  SpO2 100 % 12/02/22 1510  Vitals shown include unvalidated device data.  Last Pain:  Vitals:   12/02/22 1043  TempSrc:   PainSc: 2          Complications: No notable events documented.

## 2022-12-02 NOTE — ED Triage Notes (Signed)
Epigastric pain described as burning sensation with n/v/d  x6 hours with emesis x1.  Scheduled for cholecystomy 2/16.

## 2022-12-02 NOTE — ED Provider Notes (Signed)
[facility] Provider Note   CSN: [id number] Arrival date & time: 12/02/22  9629     History  Chief Complaint  Patient presents with   Abdominal Pain   Emesis    Emily Morgan is a 32 y.o. female.  With PMH of PCOS, symptomatic cholelithiasis scheduled for cholecystectomy December 16, 2022 who presents with ongoing upper abdominal pain, nausea and vomiting for the past 6 hours.  Patient says this is her second attack since this past Sunday.  She says this feels similar to all previous gallbladder and gallstone attacks.  She ate [place] chicken around 8 PM yesterday.  She has not eaten since.  She is complaining of throbbing and stabbing epigastrium and right upper quadrant pain associate with nausea and nonbloody nonbilious emesis since 3 AM this morning.  She has been taking ibuprofen with no relief.  She does not take any nausea medicine.  She has had no fevers, no cough, no congestion or rhinorrhea or shortness of breath.  She is scheduled to get gallbladder removed February 16 at [facility] surgery..  Endorses loose nonbloody bowel movements.  Still passing gas.  No urinary symptoms.  Does drink alcohol socially, no alcohol use last night.   Abdominal Pain Associated symptoms: vomiting   Emesis Associated symptoms: abdominal pain        Home Medications Prior to Admission medications   Medication Sig Start Date End Date Taking? Authorizing Provider  metFORMIN (GLUCOPHAGE-XR) 500 MG 24 hr tablet Take 500 mg by mouth daily. 07/20/22   [provider]  ondansetron (ZOFRAN-ODT) 4 MG disintegrating tablet Take 1 tablet (4 mg total) by mouth every 8 (eight) hours as needed for nausea or vomiting. 10/20/22   Henderly, Britni A, PA-C  oxyCODONE-acetaminophen (PERCOCET/ROXICET) 5-325 MG tablet Take 1 tablet by mouth every 6 (six) hours as needed for severe pain. 10/20/22   Henderly, Britni A, PA-C  VIENVA 0.1-20 MG-MCG tablet Take  1 tablet by mouth daily. 05/09/22   [provider]  Vitamin D, Ergocalciferol, (DRISDOL) 1.25 MG (50000 UNIT) CAPS capsule Take 50,000 Units by mouth once a week. 04/28/22   [provider]      Allergies    Bee venom, Cat hair extract, and Shellfish allergy    Review of Systems   Review of Systems  Gastrointestinal:  Positive for abdominal pain and vomiting.    Physical Exam Updated Vital Signs BP 137/73 (BP Location: Left Arm)   Pulse 80   Temp 97.8 F (36.6 C) (Oral)   Resp 18   Wt 122 kg   SpO2 100%   BMI 43.41 kg/m  Physical Exam Constitutional: Alert and oriented. Well appearing and in no distress. Eyes: Conjunctivae are normal. ENT      Head: Normocephalic and atraumatic.      Nose: No congestion.      Mouth/Throat: Mucous membranes are moist.      Neck: No stridor. Cardiovascular: S1, S2, regular rate warm well-perfused Respiratory: Normal respiratory effort. Breath sounds are normal.  O2 sat 100 on RA Gastrointestinal: Soft and nondistended with epigastrium and right upper quadrant tenderness, no rebound or guarding, not peritonitic Musculoskeletal: Normal range of motion in all extremities. Neurologic: Normal speech and language. No gross focal neurologic deficits are appreciated. Skin: Skin is warm, dry and intact. No rash noted. Psychiatric: Mood and affect are normal. Speech and behavior are normal.  ED Results / Procedures / Treatments   Labs (all labs  ordered are listed, but only abnormal results are displayed) Labs Reviewed  COMPREHENSIVE METABOLIC PANEL - Abnormal; Notable for the following components:      Result Value   Glucose, Bld 105 (*)    Total Protein 9.7 (*)    All other components within normal limits  CBC WITH DIFFERENTIAL/PLATELET - Abnormal; Notable for the following components:   Hemoglobin 11.5 (*)    MCH 25.3 (*)    All other components within normal limits  URINALYSIS, ROUTINE W REFLEX MICROSCOPIC - Abnormal;  Notable for the following components:   Hgb urine dipstick LARGE (*)    Leukocytes,Ua TRACE (*)    Bacteria, UA RARE (*)    All other components within normal limits  LIPASE, BLOOD  HCG, QUANTITATIVE, PREGNANCY    EKG EKG Interpretation  Date/Time:  Friday December 02 2022 09:42:41 EST Ventricular Rate:  72 PR Interval:  198 QRS Duration: 85 QT Interval:  374 QTC Calculation: 410 R Axis:   8 Text Interpretation: Sinus rhythm Low voltage, precordial leads No significant change since last tracing Confirmed by Georgina Snell [phone]) on 12/02/2022 9:46:28 AM  Radiology US Abdomen Limited RUQ (LIVER/GB)  Result Date: 12/02/2022 CLINICAL DATA:  Cholelithiasis. EXAM: ULTRASOUND ABDOMEN LIMITED RIGHT UPPER QUADRANT COMPARISON:  Right upper quadrant ultrasound 10/20/2022. FINDINGS: Gallbladder: Multiple gallstones measuring up to 16 mm. No wall thickening or pericholecystic fluid. Negative sonographic Murphy's sign. Common bile duct: Diameter: 4.7 mm Liver: Hyperechoic liver, consistent with hepatic steatosis. Focal fatty sparing along the gallbladder fossa. Portal vein is patent on color Doppler imaging with normal direction of blood flow towards the liver. Other: None. IMPRESSION: 1. Cholelithiasis without sonographic evidence of acute cholecystitis. 2. Hepatic steatosis. Electronically Signed   By: Emmit Alexanders M.D.   On: 12/02/2022 09:51    Procedures Procedures  Remain on constant cardiac monitoring normal sinus rhythm with normal rates.  Medications Ordered in ED Medications  morphine (PF) 4 MG/ML injection 4 mg (4 mg Intravenous Given 12/02/22 0924)  ondansetron (ZOFRAN) injection 4 mg (4 mg Intravenous Given 12/02/22 0925)  famotidine (PEPCID) IVPB 20 mg premix (20 mg Intravenous New Bag/Given 12/02/22 0924)  lactated ringers bolus 1,000 mL (1,000 mLs Intravenous New Bag/Given 12/02/22 1062)    ED Course/ Medical Decision Making/ A&P Clinical Course as of 12/02/22 1114  Fri Dec 02, 2022  0945 Labs reviewed generally unremarkable normal white blood cell count 4.4.  Stable anemia hemoglobin 11.5.  Creatinine 0.77.  No transaminitis, normal lipase 40, no concern for acute pancreatitis. [VB]  [id number] Ultrasound with evidence of cholelithiasis without cholecystitis.  Personally reviewed agree with read by radiology. [VB]  6948 Reassessed patient, she is currently improved with pain but still endorsing pain.  She has been able to tolerate a sip of water.  I did note that there is no indication for emergent surgery however she is requesting I reach out to surgery to determine if they could remove gallbladder sooner as attacks have become more frequent and persistent.  I have spoken to the PA of general surgery Alferd Apa who will see patient. [VB]  5462 Discussed with surgery, they will take patient to surgery today for cholecystectomy. [VB]    Clinical Course User Index [VB] Elgie Congo, MD                            Medical Decision Making Emily Morgan is a 32 y.o. female.  With PMH of  PCOS, symptomatic cholelithiasis scheduled for cholecystectomy December 16, 2022 who presents with ongoing upper abdominal pain, nausea and vomiting for the past 6 hours.  Based on the patient's right upper quadrant pain, differential includes but is not limited to cholelithiasis, cholecystitis, hepatitis, GERD, PUD, pancreatitis. Less likely nephrolithiasis or pyelonephritis with no CVAT and no urinary symptoms. Less likely pulmonary source such as pneumonia with no cardiopulmonary complaints, no hypoxia, and no increased work of breathing.  Labs reviewed generally unremarkable normal white blood cell count 4.4.  Stable anemia hemoglobin 11.5.  Creatinine 0.77.  No transaminitis, normal lipase 40, no concern for acute pancreatitis. [VB] Ultrasound with evidence of cholelithiasis without cholecystitis.  Personally reviewed agree with read by radiology. [VB]  Reassessed patient, she is  currently improved with pain but still endorsing pain.  She has been able to tolerate a sip of water.  I did note that there is no indication for emergent surgery however she is requesting I reach out to surgery to determine if they could remove gallbladder sooner as attacks have become more frequent and persistent.  I have spoken to the PA of general surgery Alferd Apa who will see patient. [VB]  Discussed with surgery, they will take patient to surgery today for cholecystectomy. [VB]      Amount and/or Complexity of Data Reviewed Labs: ordered. Radiology: ordered.  Risk Prescription drug management. Decision regarding hospitalization.    Final Clinical Impression(s) / ED Diagnoses Final diagnoses:  Nausea and vomiting, unspecified vomiting type  Calculus of gallbladder without cholecystitis without obstruction    Rx / DC Orders ED Discharge Orders     None         Elgie Congo, MD 12/02/22 1114

## 2022-12-02 NOTE — Anesthesia Preprocedure Evaluation (Signed)
Anesthesia Evaluation  Patient identified by MRN, date of birth, ID band Patient awake    Reviewed: Allergy & Precautions, NPO status , Patient's Chart, lab work & pertinent test results  Airway Mallampati: II  TM Distance: >3 FB Neck ROM: Full    Dental  (+) Dental Advisory Given   Pulmonary neg pulmonary ROS   breath sounds clear to auscultation       Cardiovascular negative cardio ROS  Rhythm:Regular Rate:Normal     Neuro/Psych negative neurological ROS     GI/Hepatic negative GI ROS, Neg liver ROS,,,  Endo/Other  diabetes, Oral Hypoglycemic Agents  Morbid obesity  Renal/GU negative Renal ROS     Musculoskeletal  (+) Arthritis ,    Abdominal   Peds  Hematology negative hematology ROS (+)   Anesthesia Other Findings   Reproductive/Obstetrics                             Anesthesia Physical Anesthesia Plan  ASA: 3  Anesthesia Plan: General   Post-op Pain Management: Tylenol PO (pre-op)* and Toradol IV (intra-op)*   Induction: Intravenous  PONV Risk Score and Plan: 3 and Midazolam, Dexamethasone, Ondansetron and Treatment may vary due to age or medical condition  Airway Management Planned: Oral ETT  Additional Equipment:   Intra-op Plan:   Post-operative Plan: Extubation in OR  Informed Consent: I have reviewed the patients History and Physical, chart, labs and discussed the procedure including the risks, benefits and alternatives for the proposed anesthesia with the patient or authorized representative who has indicated his/her understanding and acceptance.     Dental advisory given  Plan Discussed with: CRNA  Anesthesia Plan Comments:        Anesthesia Quick Evaluation

## 2022-12-02 NOTE — Op Note (Signed)
PATIENT:  Emily Morgan  32 y.o. female  PRE-OPERATIVE DIAGNOSIS:  cholecystitis  POST-OPERATIVE DIAGNOSIS:  cholecystitis  PROCEDURE:  Procedure(s): LAPAROSCOPIC CHOLECYSTECTOMY  SURGEON:  , Arta Bruce, MD   ASSISTANT: none  ANESTHESIA:   local and general  Indications for procedure: [name] is a 32 y.o. female with symptoms of Abdominal pain and Nausea and vomiting consistent with gallbladder disease, Confirmed by ultrasound.  Description of procedure: The patient was brought into the operative suite, placed supine. Anesthesia was administered with endotracheal tube. Patient was strapped in place and foot board was secured. All pressure points were offloaded by foam padding. The patient was prepped and draped in the usual sterile fashion.  A periumbilical incision was made and optical entry was used to enter the abdomen. 2 5 mm trocars were placed on in the right lateral space on in the right subcostal space. A 61mm trocar was placed in the subxiphoid space. Marcaine was infused to the subxiphoid space and lateral upper right abdomen in the transversus abdominis plane. Next the patient was placed in reverse trendelenberg. The gallbladder appearedfull of stones and acutely inflamed. Omentum was adhered to the gallbladder and was taken down with cautery/blunt dissection.  The gallbladder was retracted cephalad and lateral. The peritoneum was reflected off the infundibulum working lateral to medial. The cystic duct and cystic artery were identified and further dissection revealed a critical view. The cystic duct and cystic artery were doubly clipped and ligated.   The gallbladder was removed off the liver bed with cautery. The Gallbladder was placed in a specimen bag. The gallbladder fossa was irrigated and hemostasis was applied with cautery. The gallbladder was removed via the 36mm trocar. The fascial defect was closed with interrupted 0 vicryl suture via laparoscopic  trans-fascial suture passer. Pneumoperitoneum was removed, all trocar were removed. All incisions were closed with 4-0 monocryl subcuticular stitch. The patient woke from anesthesia and was brought to PACU in stable condition. All counts were correct  Findings: acute calculous cholecystitis  Specimen: gallbladder  Blood loss: 20 ml  Local anesthesia: 30 ml Marcaine  Complications: none  PLAN OF CARE: Discharge to home after PACU  PATIENT DISPOSITION:  PACU - hemodynamically stable.  [facility], [place]

## 2022-12-03 ENCOUNTER — Encounter (HOSPITAL_COMMUNITY): Payer: Self-pay | Admitting: General Surgery

## 2022-12-05 ENCOUNTER — Other Ambulatory Visit: Payer: Self-pay

## 2022-12-05 LAB — SURGICAL PATHOLOGY

## 2023-01-01 ENCOUNTER — Emergency Department (HOSPITAL_BASED_OUTPATIENT_CLINIC_OR_DEPARTMENT_OTHER)
Admission: EM | Admit: 2023-01-01 | Discharge: 2023-01-02 | Disposition: A | Discharge status: Home / Self Care | Payer: No Typology Code available for payment source | Source: Home / Self Care | Attending: Emergency Medicine | Admitting: Emergency Medicine

## 2023-01-01 ENCOUNTER — Other Ambulatory Visit: Payer: Self-pay

## 2023-01-01 ENCOUNTER — Emergency Department (HOSPITAL_BASED_OUTPATIENT_CLINIC_OR_DEPARTMENT_OTHER): Payer: No Typology Code available for payment source

## 2023-01-01 ENCOUNTER — Emergency Department (HOSPITAL_BASED_OUTPATIENT_CLINIC_OR_DEPARTMENT_OTHER): Payer: No Typology Code available for payment source | Admitting: Radiology

## 2023-01-01 ENCOUNTER — Encounter (HOSPITAL_BASED_OUTPATIENT_CLINIC_OR_DEPARTMENT_OTHER): Payer: Self-pay

## 2023-01-01 DIAGNOSIS — Z9049 Acquired absence of other specified parts of digestive tract: Secondary | ICD-10-CM | POA: Diagnosis not present

## 2023-01-01 DIAGNOSIS — S02602B Fracture of unspecified part of body of left mandible, initial encounter for open fracture: Secondary | ICD-10-CM

## 2023-01-01 DIAGNOSIS — Z23 Encounter for immunization: Secondary | ICD-10-CM | POA: Insufficient documentation

## 2023-01-01 DIAGNOSIS — E282 Polycystic ovarian syndrome: Secondary | ICD-10-CM | POA: Diagnosis not present

## 2023-01-01 DIAGNOSIS — S069XAA Unspecified intracranial injury with loss of consciousness status unknown, initial encounter: Secondary | ICD-10-CM | POA: Diagnosis not present

## 2023-01-01 DIAGNOSIS — G4730 Sleep apnea, unspecified: Secondary | ICD-10-CM | POA: Diagnosis not present

## 2023-01-01 DIAGNOSIS — M25532 Pain in left wrist: Secondary | ICD-10-CM | POA: Diagnosis not present

## 2023-01-01 DIAGNOSIS — S02652A Fracture of angle of left mandible, initial encounter for closed fracture: Secondary | ICD-10-CM | POA: Diagnosis present

## 2023-01-01 MED ORDER — MORPHINE SULFATE (PF) 4 MG/ML IV SOLN
4.0000 mg | Freq: Once | INTRAVENOUS | Status: AC
  Administered 2023-01-01: 4 mg via INTRAVENOUS
  Filled 2023-01-01: qty 1

## 2023-01-01 MED ORDER — ONDANSETRON HCL 4 MG/2ML IJ SOLN
4.0000 mg | Freq: Once | INTRAMUSCULAR | Status: AC
  Administered 2023-01-01: 4 mg via INTRAVENOUS
  Filled 2023-01-01: qty 2
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
## 2023-01-01 NOTE — ED Notes (Signed)
Patient to CT.

## 2023-01-02 ENCOUNTER — Ambulatory Visit: Payer: Self-pay | Admitting: Otolaryngology

## 2023-01-02 ENCOUNTER — Ambulatory Visit (HOSPITAL_COMMUNITY)
Admission: AD | Admit: 2023-01-02 | Discharge: 2023-01-02 | Disposition: A | Discharge status: Home / Self Care | Payer: No Typology Code available for payment source | Source: Ambulatory Visit | Attending: Otolaryngology | Admitting: Otolaryngology

## 2023-01-02 ENCOUNTER — Encounter (HOSPITAL_COMMUNITY): Payer: Self-pay | Admitting: Otolaryngology

## 2023-01-02 ENCOUNTER — Encounter (HOSPITAL_COMMUNITY)
Admission: AD | Disposition: A | Discharge status: Home / Self Care | Payer: Self-pay | Source: Ambulatory Visit | Attending: Otolaryngology

## 2023-01-02 ENCOUNTER — Inpatient Hospital Stay (HOSPITAL_COMMUNITY): Payer: No Typology Code available for payment source | Admitting: Anesthesiology

## 2023-01-02 DIAGNOSIS — G4733 Obstructive sleep apnea (adult) (pediatric): Secondary | ICD-10-CM

## 2023-01-02 DIAGNOSIS — Z9989 Dependence on other enabling machines and devices: Secondary | ICD-10-CM

## 2023-01-02 DIAGNOSIS — M1990 Unspecified osteoarthritis, unspecified site: Secondary | ICD-10-CM | POA: Diagnosis not present

## 2023-01-02 DIAGNOSIS — Z23 Encounter for immunization: Secondary | ICD-10-CM | POA: Insufficient documentation

## 2023-01-02 DIAGNOSIS — S02609A Fracture of mandible, unspecified, initial encounter for closed fracture: Secondary | ICD-10-CM | POA: Diagnosis not present

## 2023-01-02 DIAGNOSIS — S069XAA Unspecified intracranial injury with loss of consciousness status unknown, initial encounter: Secondary | ICD-10-CM | POA: Insufficient documentation

## 2023-01-02 DIAGNOSIS — E282 Polycystic ovarian syndrome: Secondary | ICD-10-CM | POA: Insufficient documentation

## 2023-01-02 DIAGNOSIS — M25532 Pain in left wrist: Secondary | ICD-10-CM | POA: Insufficient documentation

## 2023-01-02 DIAGNOSIS — S02652A Fracture of angle of left mandible, initial encounter for closed fracture: Secondary | ICD-10-CM | POA: Insufficient documentation

## 2023-01-02 DIAGNOSIS — G4730 Sleep apnea, unspecified: Secondary | ICD-10-CM | POA: Insufficient documentation

## 2023-01-02 DIAGNOSIS — Z9049 Acquired absence of other specified parts of digestive tract: Secondary | ICD-10-CM | POA: Insufficient documentation

## 2023-01-02 HISTORY — PX: ORIF MANDIBULAR FRACTURE: SHX2127

## 2023-01-02 HISTORY — DX: Sleep apnea, unspecified: G47.30

## 2023-01-02 LAB — POCT PREGNANCY, URINE: Preg Test, Ur: NEGATIVE

## 2023-01-02 SURGERY — OPEN REDUCTION INTERNAL FIXATION (ORIF) MANDIBULAR FRACTURE
Anesthesia: General

## 2023-01-02 MED ORDER — TETANUS-DIPHTH-ACELL PERTUSSIS 5-2.5-18.5 LF-MCG/0.5 IM SUSY
0.5000 mL | PREFILLED_SYRINGE | Freq: Once | INTRAMUSCULAR | Status: AC
  Administered 2023-01-02: 0.5 mL via INTRAMUSCULAR
  Filled 2023-01-02: qty 0.5

## 2023-01-02 MED ORDER — HYDROCODONE-ACETAMINOPHEN 7.5-325 MG/15ML PO SOLN: 15.0000 mL | Freq: Four times a day (QID) | ORAL | 0 refills | Status: AC | PRN

## 2023-01-02 MED ORDER — LACTATED RINGERS IV SOLN: INTRAVENOUS | Status: DC

## 2023-01-02 MED ORDER — OXYCODONE HCL 5 MG/5ML PO SOLN: 5.0000 mg | Freq: Once | ORAL | Status: DC | PRN

## 2023-01-02 MED ORDER — SUGAMMADEX SODIUM 200 MG/2ML IV SOLN
INTRAVENOUS | Status: DC | PRN
  Administered 2023-01-02: 300 mg via INTRAVENOUS

## 2023-01-02 MED ORDER — CLINDAMYCIN HCL 300 MG PO CAPS: 300.0000 mg | ORAL_CAPSULE | Freq: Three times a day (TID) | ORAL | 0 refills | Status: AC

## 2023-01-02 MED ORDER — PROPOFOL 10 MG/ML IV BOLUS
INTRAVENOUS | Status: AC
  Filled 2023-01-02: qty 20

## 2023-01-02 MED ORDER — SODIUM CHLORIDE 0.9 % IV BOLUS
1000.0000 mL | Freq: Once | INTRAVENOUS | Status: AC
  Administered 2023-01-02: 1000 mL via INTRAVENOUS

## 2023-01-02 MED ORDER — FENTANYL CITRATE (PF) 100 MCG/2ML IJ SOLN: 50.0000 ug | INTRAMUSCULAR | Status: DC | PRN

## 2023-01-02 MED ORDER — LIDOCAINE 2% (20 MG/ML) 5 ML SYRINGE
INTRAMUSCULAR | Status: DC | PRN
  Administered 2023-01-02: 40 mg via INTRAVENOUS

## 2023-01-02 MED ORDER — OXYMETAZOLINE HCL 0.05 % NA SOLN
NASAL | Status: AC
  Filled 2023-01-02: qty 30

## 2023-01-02 MED ORDER — ACETAMINOPHEN 10 MG/ML IV SOLN: 1000.0000 mg | Freq: Once | INTRAVENOUS | Status: DC | PRN

## 2023-01-02 MED ORDER — DEXAMETHASONE SODIUM PHOSPHATE 10 MG/ML IJ SOLN
INTRAMUSCULAR | Status: DC | PRN
  Administered 2023-01-02: 10 mg via INTRAVENOUS

## 2023-01-02 MED ORDER — HYDROMORPHONE HCL 1 MG/ML IJ SOLN
1.0000 mg | Freq: Once | INTRAMUSCULAR | Status: AC
  Administered 2023-01-02: 1 mg via INTRAVENOUS
  Filled 2023-01-02: qty 1

## 2023-01-02 MED ORDER — MIDAZOLAM HCL 2 MG/2ML IJ SOLN
INTRAMUSCULAR | Status: AC
  Filled 2023-01-02: qty 2

## 2023-01-02 MED ORDER — CHLORHEXIDINE GLUCONATE 0.12 % MT SOLN
15.0000 mL | Freq: Once | OROMUCOSAL | Status: AC
  Administered 2023-01-02: 15 mL via OROMUCOSAL
  Filled 2023-01-02: qty 15

## 2023-01-02 MED ORDER — PROMETHAZINE HCL 25 MG/ML IJ SOLN: 6.2500 mg | INTRAMUSCULAR | Status: DC | PRN

## 2023-01-02 MED ORDER — CLINDAMYCIN HCL 300 MG PO CAPS: 300.0000 mg | ORAL_CAPSULE | Freq: Three times a day (TID) | ORAL | 0 refills | Status: DC

## 2023-01-02 MED ORDER — SODIUM CHLORIDE 0.9 % IV SOLN: 3.0000 g | Freq: Once | INTRAVENOUS | Status: DC

## 2023-01-02 MED ORDER — LIDOCAINE-EPINEPHRINE 1 %-1:100000 IJ SOLN
INTRAMUSCULAR | Status: AC
  Filled 2023-01-02: qty 1

## 2023-01-02 MED ORDER — ROCURONIUM BROMIDE 10 MG/ML (PF) SYRINGE
PREFILLED_SYRINGE | INTRAVENOUS | Status: DC | PRN
  Administered 2023-01-02: 50 mg via INTRAVENOUS
  Administered 2023-01-02: 70 mg via INTRAVENOUS

## 2023-01-02 MED ORDER — PROPOFOL 10 MG/ML IV BOLUS
INTRAVENOUS | Status: DC | PRN
  Administered 2023-01-02: 150 mg via INTRAVENOUS
  Administered 2023-01-02: 50 mg via INTRAVENOUS

## 2023-01-02 MED ORDER — MIDAZOLAM HCL 2 MG/2ML IJ SOLN
INTRAMUSCULAR | Status: DC | PRN
  Administered 2023-01-02: 2 mg via INTRAVENOUS

## 2023-01-02 MED ORDER — OXYMETAZOLINE HCL 0.05 % NA SOLN
2.0000 | NASAL | Status: DC
  Administered 2023-01-02: 2 via NASAL
  Filled 2023-01-02: qty 30

## 2023-01-02 MED ORDER — ONDANSETRON 4 MG PO TBDP: 4.0000 mg | ORAL_TABLET | Freq: Three times a day (TID) | ORAL | 0 refills | Status: AC | PRN

## 2023-01-02 MED ORDER — ONDANSETRON HCL 4 MG/2ML IJ SOLN
INTRAMUSCULAR | Status: DC | PRN
  Administered 2023-01-02: 4 mg via INTRAVENOUS

## 2023-01-02 MED ORDER — ORAL CARE MOUTH RINSE: 15.0000 mL | Freq: Once | OROMUCOSAL | Status: AC

## 2023-01-02 MED ORDER — HYDROCODONE-ACETAMINOPHEN 7.5-325 MG/15ML PO SOLN: 15.0000 mL | Freq: Four times a day (QID) | ORAL | 0 refills | Status: DC | PRN

## 2023-01-02 MED ORDER — KETOROLAC TROMETHAMINE 30 MG/ML IJ SOLN
INTRAMUSCULAR | Status: AC
  Filled 2023-01-02: qty 1

## 2023-01-02 MED ORDER — FENTANYL CITRATE (PF) 100 MCG/2ML IJ SOLN
25.0000 ug | INTRAMUSCULAR | Status: DC | PRN
  Administered 2023-01-02: 50 ug via INTRAVENOUS

## 2023-01-02 MED ORDER — CLINDAMYCIN PHOSPHATE 600 MG/50ML IV SOLN
600.0000 mg | Freq: Once | INTRAVENOUS | Status: AC
  Administered 2023-01-02: 600 mg via INTRAVENOUS
  Filled 2023-01-02: qty 50

## 2023-01-02 MED ORDER — AMISULPRIDE (ANTIEMETIC) 5 MG/2ML IV SOLN: 10.0000 mg | Freq: Once | INTRAVENOUS | Status: DC | PRN

## 2023-01-02 MED ORDER — OXYCODONE HCL 5 MG PO TABS: 5.0000 mg | ORAL_TABLET | Freq: Once | ORAL | Status: DC | PRN

## 2023-01-02 MED ORDER — FENTANYL CITRATE (PF) 250 MCG/5ML IJ SOLN
INTRAMUSCULAR | Status: AC
  Filled 2023-01-02: qty 5

## 2023-01-02 MED ORDER — FENTANYL CITRATE (PF) 100 MCG/2ML IJ SOLN
INTRAMUSCULAR | Status: AC
  Filled 2023-01-02: qty 2

## 2023-01-02 MED ORDER — KETOROLAC TROMETHAMINE 30 MG/ML IJ SOLN
30.0000 mg | Freq: Once | INTRAMUSCULAR | Status: AC
  Administered 2023-01-02: 30 mg via INTRAVENOUS

## 2023-01-02 MED ORDER — FENTANYL CITRATE (PF) 100 MCG/2ML IJ SOLN
INTRAMUSCULAR | Status: AC
  Administered 2023-01-02: 50 ug via INTRAVENOUS
  Filled 2023-01-02: qty 2

## 2023-01-02 MED ORDER — FENTANYL CITRATE (PF) 250 MCG/5ML IJ SOLN
INTRAMUSCULAR | Status: DC | PRN
  Administered 2023-01-02 (×2): 50 ug via INTRAVENOUS

## 2023-01-02 SURGICAL SUPPLY — 39 items
BAG COUNTER SPONGE SURGICOUNT (BAG) IMPLANT
BAG SPNG CNTER NS LX DISP (BAG)
BAR FIX PREFORMED OMNIMAX (Miscellaneous) IMPLANT
BLADE MANDIBULAR SCREWDRIVER (BLADE) IMPLANT
BLADE SURG 15 STRL LF DISP TIS (BLADE) IMPLANT
BLADE SURG 15 STRL SS (BLADE)
CANISTER SUCT 3000ML PPV (MISCELLANEOUS) ×1 IMPLANT
CLEANER TIP ELECTROSURG 2X2 (MISCELLANEOUS) ×1 IMPLANT
COVER SURGICAL LIGHT HANDLE (MISCELLANEOUS) ×1 IMPLANT
DRAPE HALF SHEET 40X57 (DRAPES) IMPLANT
DRIVER SURG ZDRIVE HIGH TORQUE (ORTHOPEDIC DISPOSABLE SUPPLIES) IMPLANT
ELECT COATED BLADE 2.86 ST (ELECTRODE) ×1 IMPLANT
ELECT NDL TIP 2.8 STRL (NEEDLE) IMPLANT
ELECT NEEDLE TIP 2.8 STRL (NEEDLE) IMPLANT
ELECT REM PT RETURN 9FT ADLT (ELECTROSURGICAL) ×1
ELECTRODE REM PT RTRN 9FT ADLT (ELECTROSURGICAL) ×1 IMPLANT
GLOVE ECLIPSE 7.5 STRL STRAW (GLOVE) ×1 IMPLANT
GOWN STRL REUS W/ TWL LRG LVL3 (GOWN DISPOSABLE) ×2 IMPLANT
GOWN STRL REUS W/TWL LRG LVL3 (GOWN DISPOSABLE) ×2
KIT BASIN OR (CUSTOM PROCEDURE TRAY) ×1 IMPLANT
KIT TURNOVER KIT B (KITS) ×1 IMPLANT
NDL PRECISIONGLIDE 27X1.5 (NEEDLE) ×1 IMPLANT
NEEDLE PRECISIONGLIDE 27X1.5 (NEEDLE) ×1 IMPLANT
NS IRRIG 1000ML POUR BTL (IV SOLUTION) ×1 IMPLANT
PAD ARMBOARD 7.5X6 YLW CONV (MISCELLANEOUS) ×2 IMPLANT
PENCIL FOOT CONTROL (ELECTRODE) ×1 IMPLANT
SCISSORS WIRE ANG 4 3/4 DISP (INSTRUMENTS) ×1 IMPLANT
SCREW BONE 2X7 CROSS DRIVE (Screw) IMPLANT
SCREW BONE MANDIB SD 2X9 (Screw) IMPLANT
SUT CHROMIC 3 0 PS 2 (SUTURE) ×1 IMPLANT
SUT STEEL 0 (SUTURE)
SUT STEEL 0 18XMFL TIE 17 (SUTURE) IMPLANT
SUT STEEL 2 (SUTURE) IMPLANT
SUT STEEL 4 (SUTURE) IMPLANT
SUT VICRYL 4-0 PS2 18IN ABS (SUTURE) ×1 IMPLANT
TOWEL GREEN STERILE FF (TOWEL DISPOSABLE) ×1 IMPLANT
TRAY ENT MC OR (CUSTOM PROCEDURE TRAY) ×1 IMPLANT
WATER STERILE IRR 1000ML POUR (IV SOLUTION) ×1 IMPLANT
WIRE 24 GAUGE OMINIMAX MMF (WIRE) IMPLANT

## 2023-01-02 NOTE — Anesthesia Postprocedure Evaluation (Signed)
Anesthesia Post Note  Patient: Emily Morgan  Procedure(s) Performed: OPEN REDUCTION INTERNAL FIXATION (ORIF) MANDIBULAR FRACTURE     Patient location during evaluation: PACU Anesthesia Type: General Level of consciousness: awake and alert Pain management: pain level controlled Vital Signs Assessment: post-procedure vital signs reviewed and stable Respiratory status: spontaneous breathing, nonlabored ventilation, respiratory function stable and patient connected to nasal cannula oxygen Cardiovascular status: blood pressure returned to baseline and stable Postop Assessment: no apparent nausea or vomiting Anesthetic complications: no  No notable events documented.  Last Vitals:  Vitals:   01/02/23 1500 01/02/23 1515  BP: (!) 148/85 (!) 146/91  Pulse: 99 99  Resp: (!) 25 (!) 25  Temp:  37.3 C  SpO2: 94% 96%    Last Pain:  Vitals:   01/02/23 1300  TempSrc:   PainSc: [place] 

## 2023-01-02 NOTE — Anesthesia Preprocedure Evaluation (Addendum)
Anesthesia Evaluation  Patient identified by MRN, date of birth, ID band Patient awake    Reviewed: Allergy & Precautions, NPO status , Patient's Chart, lab work & pertinent test results  Airway Mallampati: Unable to assess  TM Distance: >3 FB Neck ROM: Full  Mouth opening: Limited Mouth Opening  Dental   Pulmonary sleep apnea and Continuous Positive Airway Pressure Ventilation , Patient abstained from smoking.   Pulmonary exam normal        Cardiovascular negative cardio ROS Normal cardiovascular exam     Neuro/Psych negative neurological ROS  negative psych ROS   GI/Hepatic negative GI ROS,,,(+)     substance abuse    Endo/Other    Morbid obesityPCOS (polycystic ovarian syndrome)   Renal/GU negative Renal ROS     Musculoskeletal  (+) Arthritis ,    Abdominal  (+) + obese  Peds  Hematology negative hematology ROS (+)   Anesthesia Other Findings MANDIBULAR FRACTURE  Reproductive/Obstetrics Hcg negative                             Anesthesia Physical Anesthesia Plan  ASA: 3  Anesthesia Plan: General   Post-op Pain Management:    Induction: Intravenous  PONV Risk Score and Plan: 3 and Ondansetron, Dexamethasone, Midazolam and Treatment may vary due to age or medical condition  Airway Management Planned: Nasal ETT  Additional Equipment:   Intra-op Plan:   Post-operative Plan: Extubation in OR  Informed Consent: I have reviewed the patients History and Physical, chart, labs and discussed the procedure including the risks, benefits and alternatives for the proposed anesthesia with the patient or authorized representative who has indicated his/her understanding and acceptance.     Dental advisory given  Plan Discussed with: CRNA  Anesthesia Plan Comments:        Anesthesia Quick Evaluation

## 2023-01-02 NOTE — Discharge Instructions (Addendum)
It is very important that you not eat or drink anything for the rest of the morning.  Check in at short stay at [facility] at 12 noon for surgery later this afternoon with Dr. Constance Holster.

## 2023-01-02 NOTE — Anesthesia Procedure Notes (Signed)
Procedure Name: Intubation Date/Time: 01/02/2023 1:45 PM  Performed by: Effie Berkshire, MDPre-anesthesia Checklist: Patient identified, Emergency Drugs available, Suction available and Patient being monitored Patient Re-evaluated:Patient Re-evaluated prior to induction Oxygen Delivery Method: Circle system utilized Preoxygenation: Pre-oxygenation with 100% oxygen Induction Type: IV induction Ventilation: Mask ventilation without difficulty Laryngoscope Size: Glidescope and 4 Nasal Tubes: Nasal Rae, Magill forceps- large, utilized and Right Tube size: 7.5 mm Number of attempts: 1 Airway Equipment and Method: Stylet and Oral airway Placement Confirmation: ETT inserted through vocal cords under direct vision, positive ETCO2 and breath sounds checked- equal and bilateral Secured at: 22 cm Tube secured with: Tape Dental Injury: Teeth and Oropharynx as per pre-operative assessment  Comments: Attempt #1: Nasal intubation attempted with Mac 3 via R nare, unable to visualize vocal cords. Resumed BMV. Attempt #2: Nasal intubation attempted with Glidecope 3 via L nare, unable to grasp and advance nasal ETT due to redundant soft tissue, blood and swelling. Proceeded with placement of OETT, uneventfully @ 1331.  Attempt #3: Nasal ETT placed in L nare, unable to visualize airway anatomy with bronchoscope due to secretions and redundant tissue. Ventilation maintained through OETT during attempt.  Attempt #4: Soft tip catheter advanced throught the OETT, nasal ETT advanced through the R nare, OETT removed with cathether in place, Glidescope 4 utilized to establish visualization of the vocal cords, Magills utilized to advance nasal ETT through the vocal cords under indirect visualization. Nasal ETT confirmed through the vocal cords and the soft tip catheter removed. + ETCO2, +BS bilaterally
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
## 2023-01-02 NOTE — Discharge Instructions (Signed)
Stay on a liquid diet.  Brush teeth on the outside of the teeth as you normally would.  Rinse with salt water or mouthwash several times daily.  In the event that you feel nauseated or need to vomit, cut the 4 wire loops that are attaching the upper and lower jaw and remove those wires.  There are 2 on each side.  Contact our office so we can schedule a time to replace them.

## 2023-01-02 NOTE — Transfer of Care (Signed)
Immediate Anesthesia Transfer of Care Note  Patient: Emily Morgan  Procedure(s) Performed: OPEN REDUCTION INTERNAL FIXATION (ORIF) MANDIBULAR FRACTURE  Patient Location: PACU  Anesthesia Type:General  Level of Consciousness: drowsy  Airway & Oxygen Therapy: Patient Spontanous Breathing and Patient connected to face mask oxygen  Post-op Assessment: Report given to RN and Post -op Vital signs reviewed and stable  Post vital signs: Reviewed and stable  Last Vitals:  Vitals Value Taken Time  BP 143/92 01/02/23 1441  Temp    Pulse 109 01/02/23 1443  Resp 22 01/02/23 1443  SpO2 100 % 01/02/23 1443  Vitals shown include unvalidated device data.  Last Pain:  Vitals:   01/02/23 1300  TempSrc:   PainSc: 4       Patients Stated Pain Goal: 4 ([id number])  Complications: No notable events documented.
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
## 2023-01-02 NOTE — Op Note (Signed)
OPERATIVE REPORT  DATE OF SURGERY: 01/02/2023  PATIENT:  Emily Morgan,  32 y.o. female  PRE-OPERATIVE DIAGNOSIS:  MANDIBULAR FRACTURE  POST-OPERATIVE DIAGNOSIS:  MANDIBULAR FRACTURE  PROCEDURE:  Procedure(s): Closed REDUCTION MANDIBULAR FRACTURE, maxillomandibular fixation using hybrid arch bars  SURGEON:  Beckie Salts, MD  ASSISTANTS: None  ANESTHESIA:   General   EBL: Less than 10 ml  DRAINS: None  LOCAL MEDICATIONS USED:  None  SPECIMEN:  none  COUNTS:  Correct  PROCEDURE DETAILS: The patient was taken to the operating room and placed on the operating table in the supine position. Following induction of general endotracheal anesthesia, using nasal intubation, the face was draped in standard fashion.  A cheek cheek retractor used throughout the case.  The oral cavity was inspected.  The dentition was all in good shape other than the left mandibular third molar which was adjacent to the fracture line, just posterior to it.  The tooth was slightly loose but still in good position.  I was able to establish good occlusion.  Hybrid arch bars were then placed, maxillary side using five 7 mm screws and mandible using five 9 mm screws.  The occlusion was established and for 22-gauge wires were used to hold the maxillomandibular fixation.  The occlusion looked excellent.  The mandible was stable.  The oral cavity was irrigated with saline and suction.  The patient was then awakened extubated and transferred to recovery in stable condition.    PATIENT DISPOSITION:  To PACU, stable

## 2023-01-03 ENCOUNTER — Other Ambulatory Visit: Payer: Self-pay

## 2023-01-06 ENCOUNTER — Encounter (HOSPITAL_COMMUNITY): Payer: Self-pay | Admitting: Otolaryngology

## 2023-01-07 ENCOUNTER — Other Ambulatory Visit: Payer: Self-pay

## 2023-01-07 ENCOUNTER — Encounter (HOSPITAL_BASED_OUTPATIENT_CLINIC_OR_DEPARTMENT_OTHER): Payer: Self-pay

## 2023-01-07 ENCOUNTER — Emergency Department (HOSPITAL_BASED_OUTPATIENT_CLINIC_OR_DEPARTMENT_OTHER)
Admission: EM | Admit: 2023-01-07 | Discharge: 2023-01-07 | Disposition: A | Discharge status: Home / Self Care | Payer: No Typology Code available for payment source | Attending: Emergency Medicine | Admitting: Emergency Medicine

## 2023-01-07 DIAGNOSIS — G8918 Other acute postprocedural pain: Secondary | ICD-10-CM | POA: Insufficient documentation

## 2023-01-07 DIAGNOSIS — K0889 Other specified disorders of teeth and supporting structures: Secondary | ICD-10-CM | POA: Insufficient documentation

## 2023-01-07 MED ORDER — NALOXONE HCL 0.4 MG/ML IJ SOLN: INTRAMUSCULAR | 0 refills | Status: DC

## 2023-01-07 MED ORDER — HYDROCODONE-ACETAMINOPHEN 7.5-325 MG/15ML PO SOLN
15.0000 mL | Freq: Four times a day (QID) | ORAL | 0 refills | Status: AC | PRN
  Filled 2023-01-07: qty 420, 6d supply, fill #0

## 2023-01-07 MED ORDER — NALOXONE HCL 0.4 MG/ML IJ SOLN
INTRAMUSCULAR | 0 refills | Status: DC
  Filled 2023-01-07: qty 1, fill #0

## 2023-01-07 MED ORDER — HYDROCODONE-ACETAMINOPHEN 7.5-325 MG/15ML PO SOLN: 15.0000 mL | Freq: Four times a day (QID) | ORAL | 0 refills | Status: DC | PRN
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
## 2023-01-07 NOTE — ED Triage Notes (Signed)
Patient here POV from Home.  Endorses recent ORIF of Mandible 4-5 Days ago. Seeks Evaluation today as she is having Considerable Pain despite Medications and has not been able to drink well.   NAD Noted during Triage. A&Ox4. Gcs 15. Ambulatory.

## 2023-01-09 ENCOUNTER — Other Ambulatory Visit: Payer: Self-pay

## 2023-01-09 ENCOUNTER — Other Ambulatory Visit (HOSPITAL_BASED_OUTPATIENT_CLINIC_OR_DEPARTMENT_OTHER): Payer: Self-pay

## 2023-02-06 ENCOUNTER — Encounter (HOSPITAL_BASED_OUTPATIENT_CLINIC_OR_DEPARTMENT_OTHER): Payer: Self-pay | Admitting: Otolaryngology

## 2023-02-06 ENCOUNTER — Other Ambulatory Visit: Payer: Self-pay

## 2023-02-06 ENCOUNTER — Encounter (HOSPITAL_BASED_OUTPATIENT_CLINIC_OR_DEPARTMENT_OTHER)
Admission: RE | Admit: 2023-02-06 | Discharge: 2023-02-06 | Disposition: A | Discharge status: Home / Self Care | Payer: No Typology Code available for payment source | Source: Ambulatory Visit | Attending: Otolaryngology | Admitting: Otolaryngology

## 2023-02-06 DIAGNOSIS — Z01812 Encounter for preprocedural laboratory examination: Secondary | ICD-10-CM | POA: Insufficient documentation

## 2023-02-06 LAB — BASIC METABOLIC PANEL
Anion gap: 8 (ref 5–15)
BUN: <5 mg/dL — ABNORMAL LOW (ref 6–20)
CO2: 25 mmol/L (ref 22–32)
Calcium: 9.5 mg/dL (ref 8.9–10.3)
Chloride: 100 mmol/L (ref 98–111)
Creatinine, Ser: 0.67 mg/dL (ref 0.44–1.00)
GFR, Estimated: >60 mL/min (ref >60)
Glucose, Bld: 95 mg/dL (ref 70–99)
Potassium: 3.6 mmol/L (ref 3.5–5.1)
Sodium: 133 mmol/L — ABNORMAL LOW (ref 135–145)

## 2023-02-06 LAB — POCT PREGNANCY, URINE: Preg Test, Ur: NEGATIVE

## 2023-02-13 ENCOUNTER — Ambulatory Visit (HOSPITAL_BASED_OUTPATIENT_CLINIC_OR_DEPARTMENT_OTHER)
Admission: RE | Admit: 2023-02-13 | Discharge: 2023-02-13 | Disposition: A | Discharge status: Home / Self Care | Payer: No Typology Code available for payment source | Attending: Otolaryngology | Admitting: Otolaryngology

## 2023-02-13 ENCOUNTER — Encounter (HOSPITAL_BASED_OUTPATIENT_CLINIC_OR_DEPARTMENT_OTHER)
Admission: RE | Disposition: A | Discharge status: Home / Self Care | Payer: Self-pay | Source: Home / Self Care | Attending: Otolaryngology

## 2023-02-13 ENCOUNTER — Other Ambulatory Visit: Payer: Self-pay

## 2023-02-13 ENCOUNTER — Ambulatory Visit (HOSPITAL_BASED_OUTPATIENT_CLINIC_OR_DEPARTMENT_OTHER): Payer: No Typology Code available for payment source | Admitting: Anesthesiology

## 2023-02-13 ENCOUNTER — Encounter (HOSPITAL_BASED_OUTPATIENT_CLINIC_OR_DEPARTMENT_OTHER): Payer: Self-pay | Admitting: Otolaryngology

## 2023-02-13 DIAGNOSIS — X58XXXD Exposure to other specified factors, subsequent encounter: Secondary | ICD-10-CM | POA: Insufficient documentation

## 2023-02-13 DIAGNOSIS — E661 Drug-induced obesity: Secondary | ICD-10-CM

## 2023-02-13 DIAGNOSIS — S02600A Fracture of unspecified part of body of mandible, initial encounter for closed fracture: Secondary | ICD-10-CM | POA: Diagnosis not present

## 2023-02-13 DIAGNOSIS — Z6838 Body mass index (BMI) 38.0-38.9, adult: Secondary | ICD-10-CM

## 2023-02-13 DIAGNOSIS — S02609D Fracture of mandible, unspecified, subsequent encounter for fracture with routine healing: Secondary | ICD-10-CM | POA: Insufficient documentation

## 2023-02-13 DIAGNOSIS — G4733 Obstructive sleep apnea (adult) (pediatric): Secondary | ICD-10-CM | POA: Diagnosis not present

## 2023-02-13 DIAGNOSIS — E282 Polycystic ovarian syndrome: Secondary | ICD-10-CM | POA: Insufficient documentation

## 2023-02-13 DIAGNOSIS — G4730 Sleep apnea, unspecified: Secondary | ICD-10-CM | POA: Diagnosis not present

## 2023-02-13 DIAGNOSIS — Z01818 Encounter for other preprocedural examination: Secondary | ICD-10-CM

## 2023-02-13 DIAGNOSIS — R7303 Prediabetes: Secondary | ICD-10-CM | POA: Insufficient documentation

## 2023-02-13 DIAGNOSIS — S069XAD Unspecified intracranial injury with loss of consciousness status unknown, subsequent encounter: Secondary | ICD-10-CM | POA: Insufficient documentation

## 2023-02-13 DIAGNOSIS — Z9989 Dependence on other enabling machines and devices: Secondary | ICD-10-CM | POA: Diagnosis not present

## 2023-02-13 DIAGNOSIS — S02600D Fracture of unspecified part of body of mandible, subsequent encounter for fracture with routine healing: Secondary | ICD-10-CM | POA: Diagnosis not present

## 2023-02-13 DIAGNOSIS — Z7984 Long term (current) use of oral hypoglycemic drugs: Secondary | ICD-10-CM | POA: Insufficient documentation

## 2023-02-13 HISTORY — PX: MANDIBULAR HARDWARE REMOVAL: SHX5205

## 2023-02-13 HISTORY — DX: Prediabetes: R73.03

## 2023-02-13 HISTORY — DX: Other complications of anesthesia, initial encounter: T88.59XA

## 2023-02-13 LAB — POCT PREGNANCY, URINE: Preg Test, Ur: NEGATIVE

## 2023-02-13 SURGERY — REMOVAL, HARDWARE, MANDIBLE
Anesthesia: Monitor Anesthesia Care | Site: Mouth

## 2023-02-13 MED ORDER — OXYCODONE HCL 5 MG/5ML PO SOLN: 5.0000 mg | Freq: Once | ORAL | Status: AC | PRN

## 2023-02-13 MED ORDER — LIDOCAINE-EPINEPHRINE 1 %-1:100000 IJ SOLN
INTRAMUSCULAR | Status: DC | PRN
  Administered 2023-02-13: 5 mL

## 2023-02-13 MED ORDER — OXYCODONE HCL 5 MG PO TABS
5.0000 mg | ORAL_TABLET | Freq: Once | ORAL | Status: AC | PRN
  Administered 2023-02-13: 5 mg via ORAL

## 2023-02-13 MED ORDER — PROPOFOL 10 MG/ML IV BOLUS
INTRAVENOUS | Status: AC
  Filled 2023-02-13: qty 20

## 2023-02-13 MED ORDER — LACTATED RINGERS IV SOLN: INTRAVENOUS | Status: DC

## 2023-02-13 MED ORDER — FENTANYL CITRATE (PF) 100 MCG/2ML IJ SOLN: 25.0000 ug | INTRAMUSCULAR | Status: DC | PRN

## 2023-02-13 MED ORDER — ONDANSETRON HCL 4 MG/2ML IJ SOLN
INTRAMUSCULAR | Status: AC
  Filled 2023-02-13: qty 2

## 2023-02-13 MED ORDER — DEXAMETHASONE SODIUM PHOSPHATE 10 MG/ML IJ SOLN
INTRAMUSCULAR | Status: AC
  Filled 2023-02-13: qty 1

## 2023-02-13 MED ORDER — DEXMEDETOMIDINE HCL IN NACL 400 MCG/100ML IV SOLN
INTRAVENOUS | Status: DC | PRN
  Administered 2023-02-13: 12 ug via INTRAVENOUS

## 2023-02-13 MED ORDER — ONDANSETRON HCL 4 MG/2ML IJ SOLN: 4.0000 mg | Freq: Once | INTRAMUSCULAR | Status: DC | PRN

## 2023-02-13 MED ORDER — FENTANYL CITRATE (PF) 100 MCG/2ML IJ SOLN
INTRAMUSCULAR | Status: DC | PRN
  Administered 2023-02-13: 25 ug via INTRAVENOUS
  Administered 2023-02-13: 50 ug via INTRAVENOUS

## 2023-02-13 MED ORDER — FENTANYL CITRATE (PF) 100 MCG/2ML IJ SOLN
INTRAMUSCULAR | Status: AC
  Filled 2023-02-13: qty 2

## 2023-02-13 MED ORDER — ONDANSETRON HCL 4 MG/2ML IJ SOLN
INTRAMUSCULAR | Status: DC | PRN
  Administered 2023-02-13: 4 mg via INTRAVENOUS

## 2023-02-13 MED ORDER — MIDAZOLAM HCL 2 MG/2ML IJ SOLN
INTRAMUSCULAR | Status: AC
  Filled 2023-02-13: qty 2

## 2023-02-13 MED ORDER — MIDAZOLAM HCL 5 MG/5ML IJ SOLN
INTRAMUSCULAR | Status: DC | PRN
  Administered 2023-02-13: 2 mg via INTRAVENOUS

## 2023-02-13 MED ORDER — OXYCODONE HCL 5 MG PO TABS
ORAL_TABLET | ORAL | Status: AC
  Filled 2023-02-13: qty 1

## 2023-02-13 MED ORDER — PROPOFOL 500 MG/50ML IV EMUL
INTRAVENOUS | Status: DC | PRN
  Administered 2023-02-13: 200 ug/kg/min via INTRAVENOUS

## 2023-02-13 MED ORDER — LIDOCAINE 2% (20 MG/ML) 5 ML SYRINGE
INTRAMUSCULAR | Status: AC
  Filled 2023-02-13: qty 5

## 2023-02-13 SURGICAL SUPPLY — 32 items
BLADE SURG 15 STRL LF DISP TIS (BLADE) ×1 IMPLANT
BLADE SURG 15 STRL SS (BLADE) ×1
CANISTER SUCT 1200ML W/VALVE (MISCELLANEOUS) ×1 IMPLANT
COVER MAYO STAND STRL (DRAPES) ×1 IMPLANT
DRIVER SURG ZDRIVE HIGH TORQUE (ORTHOPEDIC DISPOSABLE SUPPLIES) IMPLANT
ELECT COATED BLADE 2.86 ST (ELECTRODE) IMPLANT
ELECT REM PT RETURN 9FT ADLT (ELECTROSURGICAL)
ELECTRODE REM PT RTRN 9FT ADLT (ELECTROSURGICAL) IMPLANT
GAUZE 4X4 16PLY ~~LOC~~+RFID DBL (SPONGE) IMPLANT
GLOVE ECLIPSE 7.5 STRL STRAW (GLOVE) ×1 IMPLANT
GLOVE SURG SYN 8.0 (GLOVE) ×1 IMPLANT
GLOVE SURG SYN 8.0 PF PI (GLOVE) IMPLANT
GOWN STRL REUS W/ TWL LRG LVL3 (GOWN DISPOSABLE) ×1 IMPLANT
GOWN STRL REUS W/ TWL XL LVL3 (GOWN DISPOSABLE) IMPLANT
GOWN STRL REUS W/TWL LRG LVL3 (GOWN DISPOSABLE)
GOWN STRL REUS W/TWL XL LVL3 (GOWN DISPOSABLE) ×2
MARKER SKIN DUAL TIP RULER LAB (MISCELLANEOUS) IMPLANT
NDL HYPO 27GX1-1/4 (NEEDLE) ×1 IMPLANT
NEEDLE HYPO 27GX1-1/4 (NEEDLE) ×1 IMPLANT
NS IRRIG 1000ML POUR BTL (IV SOLUTION) IMPLANT
PACK BASIN DAY SURGERY FS (CUSTOM PROCEDURE TRAY) ×1 IMPLANT
PENCIL FOOT CONTROL (ELECTRODE) IMPLANT
SCISSORS WIRE ANG 4 3/4 DISP (INSTRUMENTS) IMPLANT
SHEET MEDIUM DRAPE 40X70 STRL (DRAPES) ×1 IMPLANT
SPIKE FLUID TRANSFER (MISCELLANEOUS) ×1 IMPLANT
SUT CHROMIC 3 0 PS 2 (SUTURE) IMPLANT
SUT CHROMIC 4 0 PS 2 18 (SUTURE) IMPLANT
SYR CONTROL 10ML LL (SYRINGE) ×1 IMPLANT
TOWEL GREEN STERILE FF (TOWEL DISPOSABLE) ×2 IMPLANT
TRAY DSU PREP LF (CUSTOM PROCEDURE TRAY) IMPLANT
TUBE CONNECTING 20X1/4 (TUBING) ×1 IMPLANT
YANKAUER SUCT BULB TIP NO VENT (SUCTIONS) ×1 IMPLANT

## 2023-02-13 NOTE — Op Note (Signed)
OPERATIVE REPORT  DATE OF SURGERY: 02/13/2023  PATIENT:  Emily Morgan,  32 y.o. female  PRE-OPERATIVE DIAGNOSIS:  Mandible fracture  POST-OPERATIVE DIAGNOSIS:  Mandible fracture  PROCEDURE:  Procedure(s): REMOVAL OF MAXILLOMANDIBULAR FIXATION  SURGEON:  Susy Frizzle, MD  ASSISTANTS: none  ANESTHESIA:   General   EBL:  30 ml  DRAINS: none  LOCAL MEDICATIONS USED:  1% Xylocaine with epinephrine  SPECIMEN:  none  COUNTS:  Correct  PROCEDURE DETAILS: The patient was taken to the operating room and placed on the operating table in the supine position. The MMF wires were cut and removed. Following induction of intravenous sedation, local anesthetic was infiltrated along the upper and lower gingival mucosa. All of the screws were identified, uncovered by mucosal overgrowth and removed with screwdriver. The upper and lower arch bar were then completely removed. The oral cavity was irrigated with saline and suctioned. The patient was awakened from sedation and transferred to recovery in stable condition.    PATIENT DISPOSITION:  To PACU, stable

## 2023-02-13 NOTE — H&P (Signed)
Emily Morgan is an 32 y.o. female.   Chief Complaint: Mandible fracture HPI: MMF applied 6 weeks ago for mandible fracture.  Doing well healing well.  Past Medical History:  Diagnosis Date   Complication of anesthesia    Pt aware/unable to move during procedure, overall bad experience   Degenerative lumbar disc    PCOS (polycystic ovarian syndrome)    Pre-diabetes    Sleep apnea     Past Surgical History:  Procedure Laterality Date   CESAREAN SECTION     CHOLECYSTECTOMY N/A 12/02/2022   Procedure: LAPAROSCOPIC CHOLECYSTECTOMY;  Surgeon: Kinsinger, De Blanch, MD;  Location: [facility] ORS;  Service: General;  Laterality: N/A;   ORIF MANDIBULAR FRACTURE N/A 01/02/2023   Procedure: OPEN REDUCTION INTERNAL FIXATION (ORIF) MANDIBULAR FRACTURE;  Surgeon: Serena Colonel, MD;  Location: [facility] OR;  Service: ENT;  Laterality: N/A;    Family History  Problem Relation Age of Onset   Hypertension Mother    Cancer Father    Social History:  reports that she has never smoked. She has never used smokeless tobacco. She reports current alcohol use. She reports that she does not use drugs.  Allergies:  Allergies  Allergen Reactions   Bee Venom Anaphylaxis, Swelling and Other (See Comments)   Cat Hair Extract Itching   Shellfish Allergy Other (See Comments)    Medications Prior to Admission  Medication Sig Dispense Refill   acetaminophen (TYLENOL) 500 MG tablet Take 2 tablets (1,000 mg total) by mouth every 8 (eight) hours as needed. 30 tablet 0   clindamycin (CLEOCIN) 300 MG capsule Take 1 capsule (300 mg total) by mouth 3 (three) times daily. 15 capsule 0   HYDROcodone-acetaminophen (HYCET) 7.5-325 mg/15 ml solution Take 15 mLs by mouth every 6 (six) hours as needed for moderate pain. 420 mL 0   metFORMIN (GLUCOPHAGE-XR) 500 MG 24 hr tablet Take 500 mg by mouth daily.     naloxone (NARCAN) 0.4 MG/ML injection Keep around, have family/caregiver administer if concern for overdose. 1 mL 0   ondansetron  (ZOFRAN-ODT) 4 MG disintegrating tablet Take 1 tablet (4 mg total) by mouth every 8 (eight) hours as needed for nausea or vomiting. 20 tablet 0   ondansetron (ZOFRAN-ODT) 4 MG disintegrating tablet Take 1 tablet (4 mg total) by mouth every 8 (eight) hours as needed for nausea or vomiting. 20 tablet 0   ondansetron (ZOFRAN-ODT) 4 MG disintegrating tablet Take 1 tablet (4 mg total) by mouth every 8 (eight) hours as needed for nausea or vomiting. 20 tablet 0   polyethylene glycol (MIRALAX / GLYCOLAX) 17 g packet Take 17 g by mouth daily as needed for mild constipation. 14 each 0   VIENVA 0.1-20 MG-MCG tablet Take 1 tablet by mouth daily.     Vitamin D, Ergocalciferol, (DRISDOL) 1.25 MG (50000 UNIT) CAPS capsule Take 50,000 Units by mouth once a week.      Results for orders placed or performed during the hospital encounter of 02/13/23 (from the past 48 hour(s))  Pregnancy, urine POC     Status: None   Collection Time: 02/13/23  9:39 AM  Result Value Ref Range   Preg Test, Ur NEGATIVE NEGATIVE    Comment:        THE SENSITIVITY OF THIS METHODOLOGY IS >24 mIU/mL    No results found.  ROS: otherwise negative  Blood pressure 128/79, temperature 98.2 F (36.8 C), temperature source Oral, resp. rate 18, height 5\' 6"  (1.676 m), weight 108 kg, SpO2 99 %.  PHYSICAL EXAM: Overall appearance:  Healthy appearing, in no distress Head:  Normocephalic, atraumatic. Ears: External auditory canals are clear; tympanic membranes are intact and the middle ears are free of any effusion. Nose: External nose is healthy in appearance. Internal nasal exam free of any lesions or obstruction. Oral Cavity/pharynx: Arch bars in place. Hypopharynx/Larynx: no signs of any mucosal lesions or masses identified. Vocal cords move normally. Neuro:  No identifiable neurologic deficits. Neck: No palpable neck masses.  Studies Reviewed: none    Assessment/Plan Proceed with MMF removal.  Serena Colonel 02/13/2023, 10:03  AM

## 2023-02-13 NOTE — Anesthesia Preprocedure Evaluation (Addendum)
Anesthesia Evaluation  Patient identified by MRN, date of birth, ID band Patient awake    Reviewed: Allergy & Precautions, NPO status , Patient's Chart, lab work & pertinent test results, reviewed documented beta blocker date and time   History of Anesthesia Complications (+) AWARENESS UNDER ANESTHESIA and history of anesthetic complications  Airway Mallampati: Unable to assess     Mouth opening: Limited Mouth Opening Comment: Mandible wired to maxilla Dental no notable dental hx. (+) Teeth Intact, Dental Advisory Given   Pulmonary sleep apnea and Continuous Positive Airway Pressure Ventilation , Patient abstained from smoking.   Pulmonary exam normal breath sounds clear to auscultation       Cardiovascular negative cardio ROS Normal cardiovascular exam Rhythm:Regular Rate:Normal     Neuro/Psych    GI/Hepatic negative GI ROS, Neg liver ROS,,,  Endo/Other    Morbid obesityPre diabetes PCOS  Renal/GU negative Renal ROS  negative genitourinary   Musculoskeletal  (+) Arthritis , Osteoarthritis,  DDD lumbar spine S/P ORIF mandibular Fx   Abdominal  (+) + obese  Peds  Hematology negative hematology ROS (+)   Anesthesia Other Findings   Reproductive/Obstetrics                             Anesthesia Physical Anesthesia Plan  ASA: 3  Anesthesia Plan: General and MAC   Post-op Pain Management: Minimal or no pain anticipated   Induction: Intravenous  PONV Risk Score and Plan: 4 or greater and Treatment may vary due to age or medical condition, Midazolam, Ondansetron and Dexamethasone  Airway Management Planned: Natural Airway and Mask  Additional Equipment: None  Intra-op Plan:   Post-operative Plan:   Informed Consent: I have reviewed the patients History and Physical, chart, labs and discussed the procedure including the risks, benefits and alternatives for the proposed anesthesia  with the patient or authorized representative who has indicated his/her understanding and acceptance.     Dental advisory given  Plan Discussed with: Anesthesiologist and CRNA  Anesthesia Plan Comments:         Anesthesia Quick Evaluation

## 2023-02-13 NOTE — Anesthesia Postprocedure Evaluation (Signed)
Anesthesia Post Note  Patient: Emily Morgan  Procedure(s) Performed: REMOVAL OF MAXILLOMANDIBULAR FIXATION (Mouth)     Patient location during evaluation: PACU Anesthesia Type: MAC Level of consciousness: awake and alert and oriented Pain management: pain level controlled Vital Signs Assessment: post-procedure vital signs reviewed and stable Respiratory status: spontaneous breathing, nonlabored ventilation and respiratory function stable Cardiovascular status: stable and blood pressure returned to baseline Postop Assessment: no apparent nausea or vomiting Anesthetic complications: no   No notable events documented.  Last Vitals:  Vitals:   02/13/23 0947 02/13/23 1105  BP: 128/79 105/62  Pulse:  98  Resp: 18 (!) 24  Temp: 36.8 C 36.8 C  SpO2: 99% 93%    Last Pain:  Vitals:   02/13/23 1122  TempSrc:   PainSc: 4                  , A.

## 2023-02-13 NOTE — Discharge Instructions (Addendum)
Rinse mouth with salt water 3 times daily until all is healed.  Resume diet as tolerated.  Use tylenol/motrin as needed.   Post Anesthesia Home Care Instructions  Activity: Get plenty of rest for the remainder of the day. A responsible individual must stay with you for 24 hours following the procedure.  For the next 24 hours, DO NOT: -Drive a car -Advertising copywriter -Drink alcoholic beverages -Take any medication unless instructed by your physician -Make any legal decisions or sign important papers.  Meals: Start with liquid foods such as gelatin or soup. Progress to regular foods as tolerated. Avoid greasy, spicy, heavy foods. If nausea and/or vomiting occur, drink only clear liquids until the nausea and/or vomiting subsides. Call your physician if vomiting continues.  Special Instructions/Symptoms: Your throat may feel dry or sore from the anesthesia or the breathing tube placed in your throat during surgery. If this causes discomfort, gargle with warm salt water. The discomfort should disappear within 24 hours.  If you had a scopolamine patch placed behind your ear for the management of post- operative nausea and/or vomiting:  1. The medication in the patch is effective for 72 hours, after which it should be removed.  Wrap patch in a tissue and discard in the trash. Wash hands thoroughly with soap and water. 2. You may remove the patch earlier than 72 hours if you experience unpleasant side effects which may include dry mouth, dizziness or visual disturbances. 3. Avoid touching the patch. Wash your hands with soap and water after contact with the patch.

## 2023-02-13 NOTE — Transfer of Care (Signed)
Immediate Anesthesia Transfer of Care Note  Patient: Emily Morgan  Procedure(s) Performed: REMOVAL OF MAXILLOMANDIBULAR FIXATION (Mouth)  Patient Location: PACU  Anesthesia Type:MAC  Level of Consciousness: awake, alert , and patient cooperative  Airway & Oxygen Therapy: Patient Spontanous Breathing and Patient connected to face mask oxygen  Post-op Assessment: Report given to RN and Post -op Vital signs reviewed and stable  Post vital signs: Reviewed and stable  Last Vitals:  Vitals Value Taken Time  BP 105/62 02/13/23 1105  Temp    Pulse 87 02/13/23 1108  Resp 20 02/13/23 1108  SpO2 95 % 02/13/23 1108  Vitals shown include unvalidated device data.  Last Pain:  Vitals:   02/13/23 0947  TempSrc: Oral  PainSc: 0-No pain      Patients Stated Pain Goal: 5 (02/13/23 0947)  Complications: No notable events documented.

## 2023-02-14 ENCOUNTER — Encounter (HOSPITAL_BASED_OUTPATIENT_CLINIC_OR_DEPARTMENT_OTHER): Payer: Self-pay | Admitting: Otolaryngology

## 2023-06-10 IMAGING — CT CT ANGIO CHEST
2 of 7 series · 17 of 46 positions shown · IV contrast (agent unspecified)
Comparison: None.

CLINICAL DATA: Chest pain

EXAM:
CT ANGIOGRAPHY CHEST WITH CONTRAST
TECHNIQUE: Multidetector CT imaging of the chest was performed using the
standard protocol during bolus administration of intravenous
contrast. Multiplanar CT image reconstructions and MIPs were
obtained to evaluate the vascular anatomy.

[Series 5: pe axial thins · axial · 0.88mm/px · z∈[-264,-29]mm · 14 of 273 slices shown]
[im 19/273  lung]
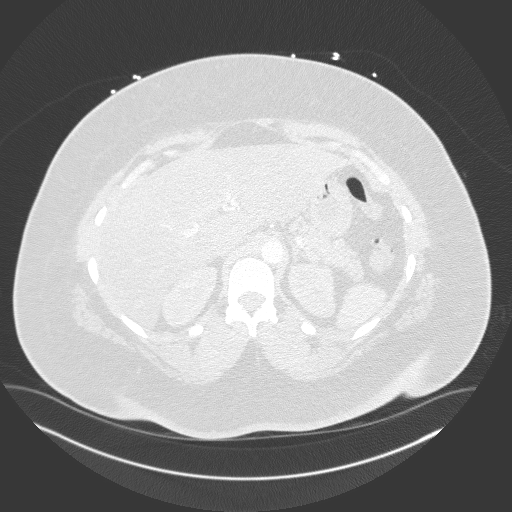
[im 37/273  soft-tissue]
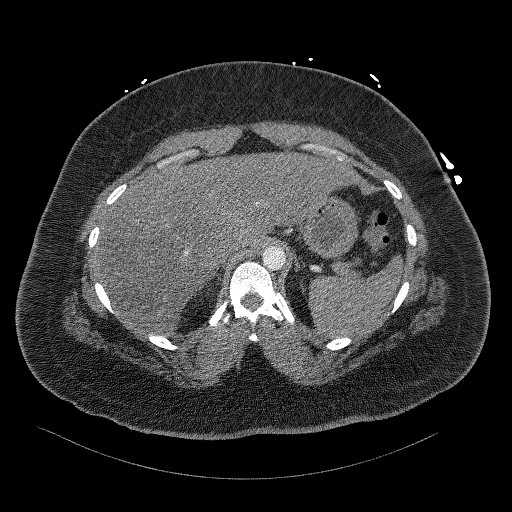
[im 55/273  lung]
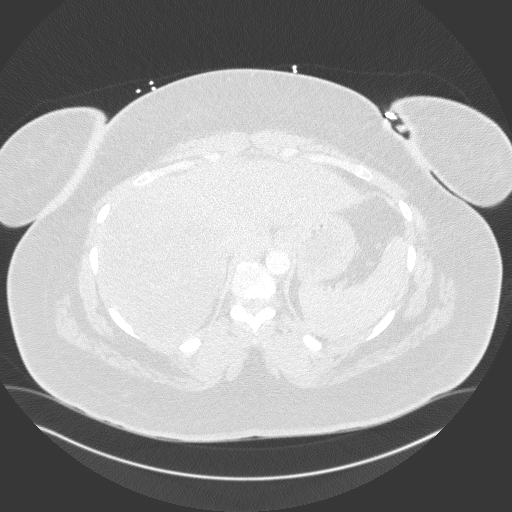
[im 73/273  soft-tissue]
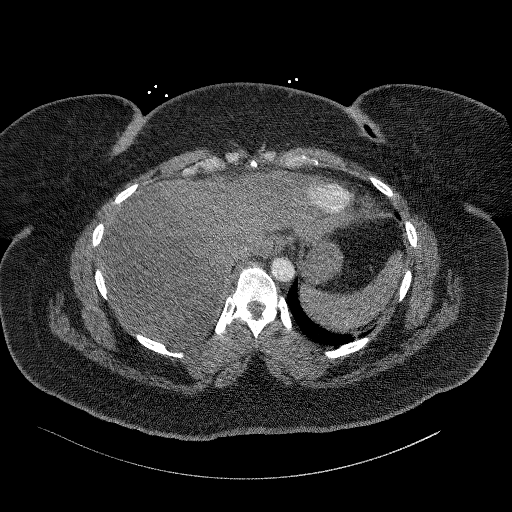
[im 91/273  lung]
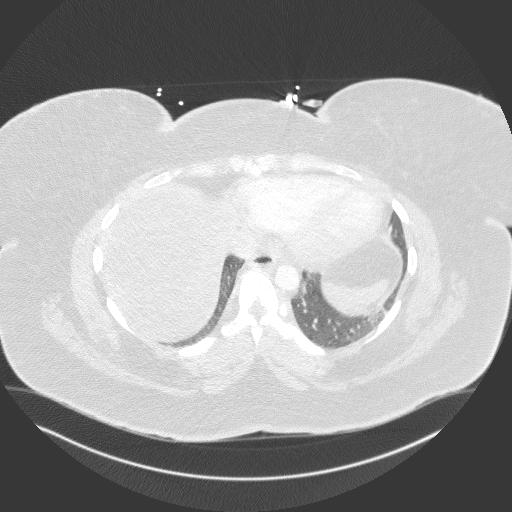
[im 109/273  soft-tissue]
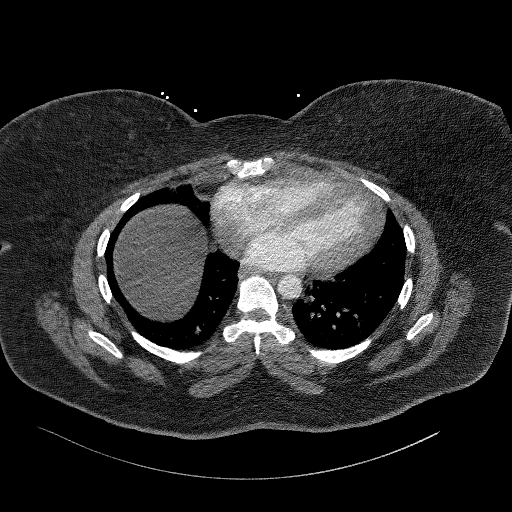
[im 127/273  lung]
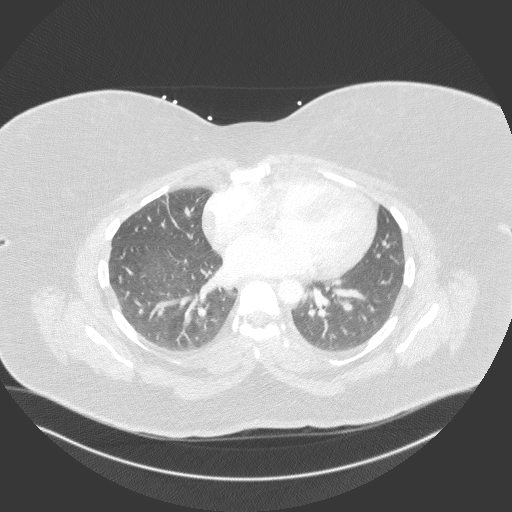
[im 146/273  soft-tissue]
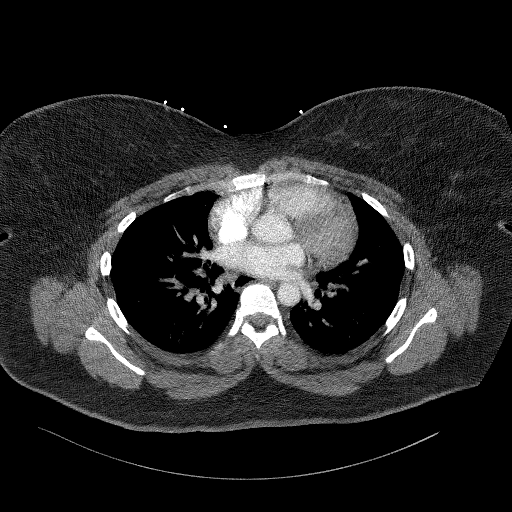
[im 164/273  lung]
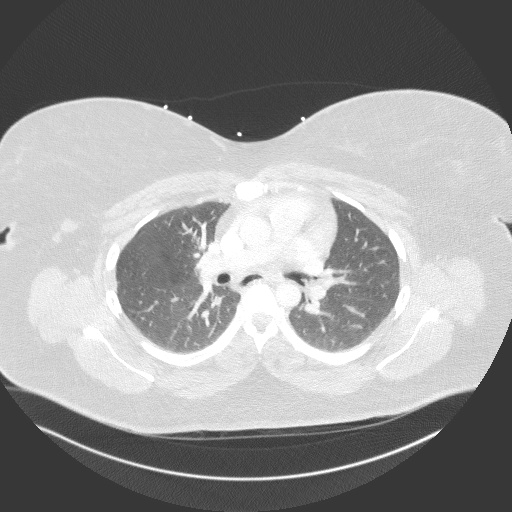
[im 182/273  soft-tissue]
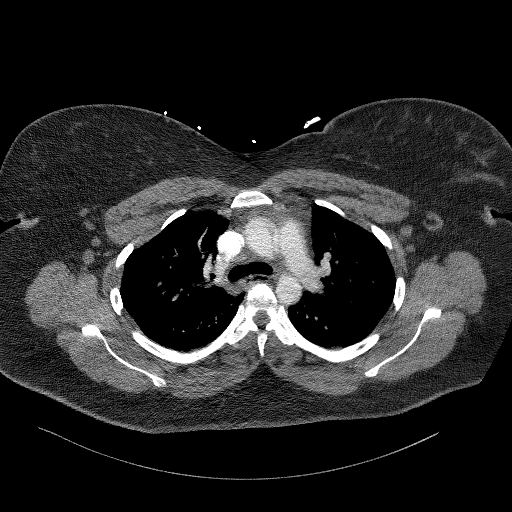
[im 200/273  lung]
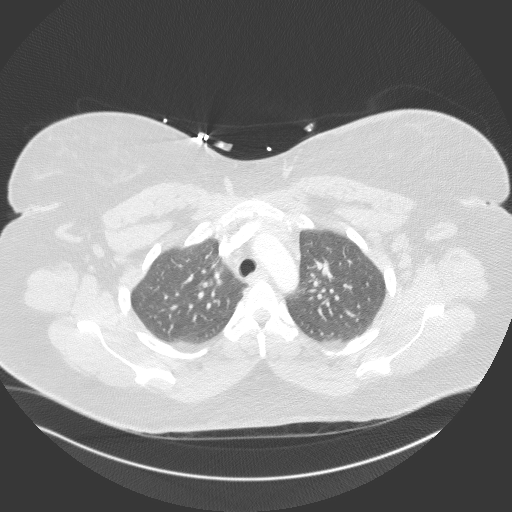
[im 218/273  soft-tissue]
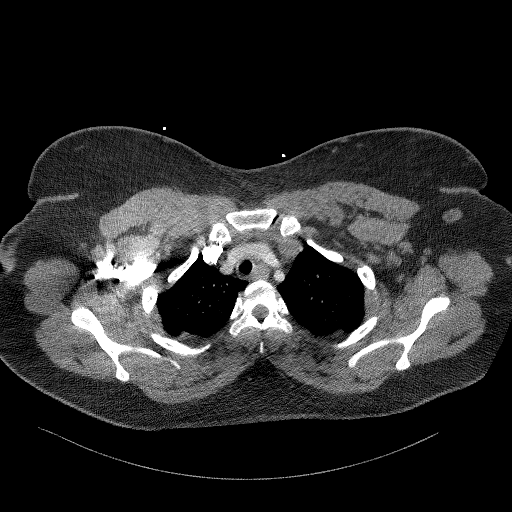
[im 236/273  lung]
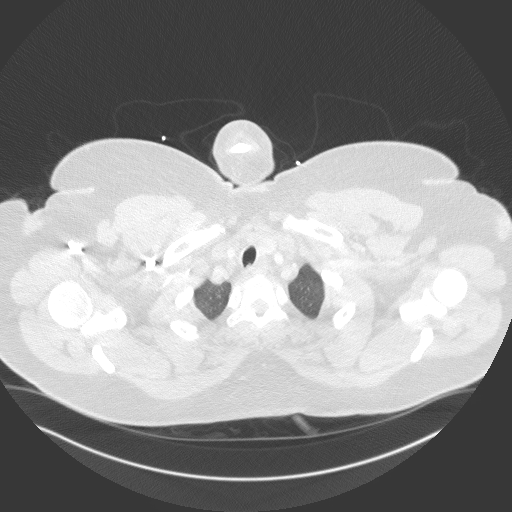
[im 254/273  soft-tissue]
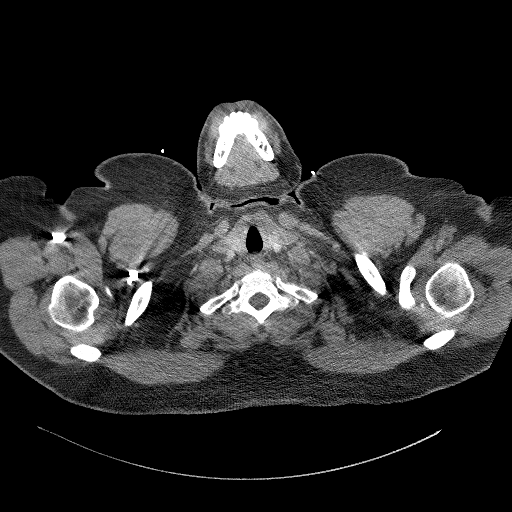

[Series 7: cor soft · coronal · 0.58mm/px · 3 of 169 slices shown]
[im 43/169  soft-tissue]
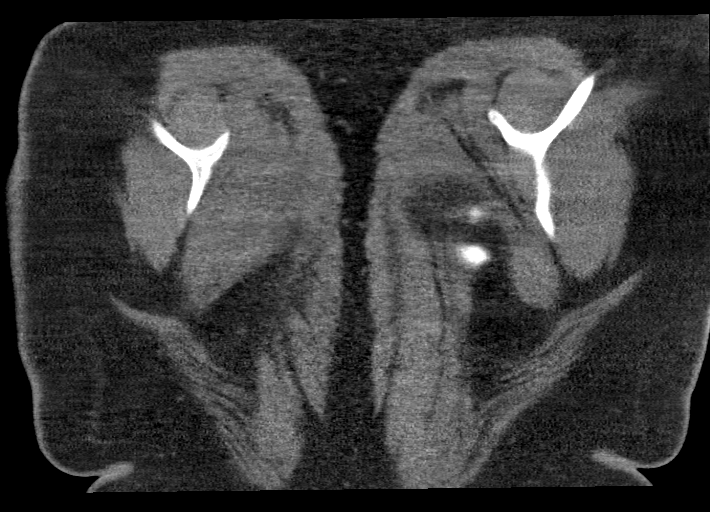
[im 85/169  soft-tissue]
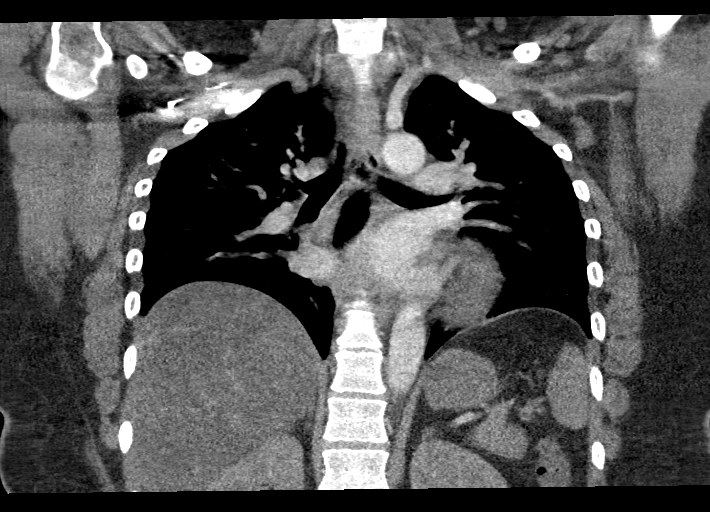
[im 127/169  soft-tissue]
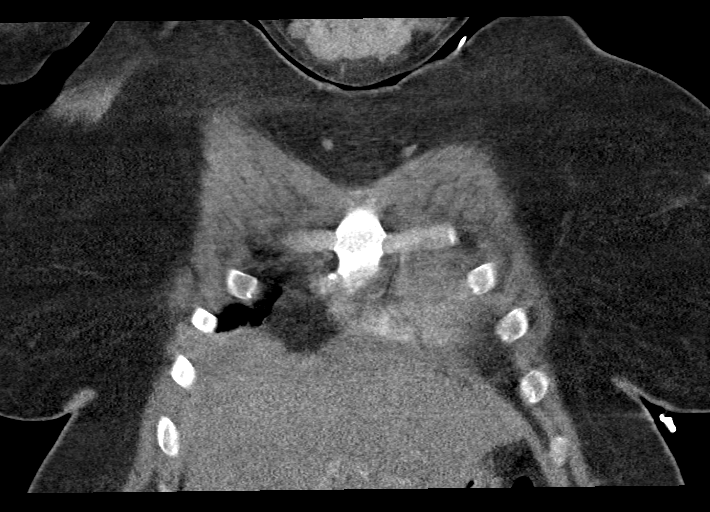

[17 of 46 positions shown; findings below may reference images not displayed]

RADIATION DOSE REDUCTION: This exam was performed according to the
departmental dose-optimization program which includes automated
exposure control, adjustment of the mA and/or kV according to
patient size and/or use of iterative reconstruction technique.

CONTRAST:  100mL OMNIPAQUE IOHEXOL 350 MG/ML SOLN
FINDINGS: Cardiovascular: The heart size is normal. No substantial pericardial
effusion. No thoracic aortic aneurysm. No substantial
atherosclerosis of the thoracic aorta. Opacification of pulmonary
arteries suboptimal due to bolus timing in technical factors. Within
this limitation there is no large central pulmonary embolus. No
evidence for lobar pulmonary embolus. Segmental and subsegmental
pulmonary arteries show no definite filling defects although
assessment may be unreliable due to bolus timing.

Mediastinum/Nodes: No mediastinal lymphadenopathy. There is no hilar
lymphadenopathy. The esophagus has normal imaging features. Upper
normal lymph nodes in the axillary and subpectoral regions
bilaterally, many of which show central fatty hila and are likely
reactive.

Lungs/Pleura: No focal airspace consolidation. No suspicious
pulmonary nodule or mass. Subtle mosaic attenuation in the lung
bases may be related to air trapping.

Upper Abdomen: The liver shows diffusely decreased attenuation
suggesting fat deposition.

Musculoskeletal: No worrisome lytic or sclerotic osseous
abnormality.

Review of the MIP images confirms the above findings.
IMPRESSION: 1. No large central pulmonary embolus. Segmental and subsegmental
pulmonary arteries show no definite filling defects although
assessment may be unreliable due to bolus timing.
2. Subtle mosaic attenuation in the lung bases may be related to air
trapping.
3. Hepatic steatosis.

## 2023-06-14 ENCOUNTER — Encounter (HOSPITAL_BASED_OUTPATIENT_CLINIC_OR_DEPARTMENT_OTHER): Payer: Self-pay | Admitting: Emergency Medicine

## 2023-06-14 ENCOUNTER — Other Ambulatory Visit: Payer: Self-pay

## 2023-06-14 DIAGNOSIS — R079 Chest pain, unspecified: Secondary | ICD-10-CM | POA: Insufficient documentation

## 2023-06-14 DIAGNOSIS — R0602 Shortness of breath: Secondary | ICD-10-CM | POA: Diagnosis not present

## 2023-06-14 NOTE — ED Triage Notes (Signed)
Pt in with sharp, squeezing L chest pain x 3 days, worse when lying down. Pt did have a L jaw fx back in May, all healing normally. Pt does state that the pain radiates into L neck and jaw at times. Mild sob and nausea reported

## 2023-06-15 ENCOUNTER — Emergency Department (HOSPITAL_BASED_OUTPATIENT_CLINIC_OR_DEPARTMENT_OTHER)
Admission: EM | Admit: 2023-06-15 | Discharge: 2023-06-15 | Disposition: A | Discharge status: Home / Self Care | Payer: No Typology Code available for payment source | Attending: Emergency Medicine | Admitting: Emergency Medicine

## 2023-06-15 ENCOUNTER — Emergency Department (HOSPITAL_BASED_OUTPATIENT_CLINIC_OR_DEPARTMENT_OTHER): Payer: No Typology Code available for payment source | Admitting: Radiology

## 2023-06-15 ENCOUNTER — Emergency Department (HOSPITAL_BASED_OUTPATIENT_CLINIC_OR_DEPARTMENT_OTHER): Payer: No Typology Code available for payment source

## 2023-06-15 DIAGNOSIS — R0602 Shortness of breath: Secondary | ICD-10-CM

## 2023-06-15 DIAGNOSIS — R079 Chest pain, unspecified: Secondary | ICD-10-CM

## 2023-06-15 LAB — BASIC METABOLIC PANEL
Anion gap: 7 (ref 5–15)
BUN: 11 mg/dL (ref 6–20)
CO2: 27 mmol/L (ref 22–32)
Calcium: 10 mg/dL (ref 8.9–10.3)
Chloride: 102 mmol/L (ref 98–111)
Creatinine, Ser: 0.73 mg/dL (ref 0.44–1.00)
GFR, Estimated: >60 mL/min (ref >60)
Glucose, Bld: 99 mg/dL (ref 70–99)
Potassium: 4 mmol/L (ref 3.5–5.1)
Sodium: 136 mmol/L (ref 135–145)

## 2023-06-15 LAB — LIPASE, BLOOD: Lipase: 31 U/L (ref 11–51)

## 2023-06-15 LAB — HEPATIC FUNCTION PANEL
ALT: 18 U/L (ref 0–44)
AST: 21 U/L (ref 15–41)
Albumin: 4 g/dL (ref 3.5–5.0)
Alkaline Phosphatase: 64 U/L (ref 38–126)
Bilirubin, Direct: 0.1 mg/dL (ref 0.0–0.2)
Indirect Bilirubin: 0.3 mg/dL (ref 0.3–0.9)
Total Bilirubin: 0.4 mg/dL (ref 0.3–1.2)
Total Protein: 9.3 g/dL — ABNORMAL HIGH (ref 6.5–8.1)

## 2023-06-15 LAB — CBC
HCT: 35.2 % — ABNORMAL LOW (ref 36.0–46.0)
Hemoglobin: 10.9 g/dL — ABNORMAL LOW (ref 12.0–15.0)
MCH: 25.3 pg — ABNORMAL LOW (ref 26.0–34.0)
MCHC: 31 g/dL (ref 30.0–36.0)
MCV: 81.9 fL (ref 80.0–100.0)
Platelets: 363 10*3/uL (ref 150–400)
RBC: 4.3 MIL/uL (ref 3.87–5.11)
RDW: 13.5 % (ref 11.5–15.5)
WBC: 7.1 10*3/uL (ref 4.0–10.5)
nRBC: 0 % (ref 0.0–0.2)

## 2023-06-15 LAB — D-DIMER, QUANTITATIVE: D-Dimer, Quant: 1.68 ug{FEU}/mL — ABNORMAL HIGH (ref 0.00–0.50)

## 2023-06-15 LAB — PREGNANCY, URINE: Preg Test, Ur: NEGATIVE

## 2023-06-15 LAB — TROPONIN I (HIGH SENSITIVITY)
Troponin I (High Sensitivity): 2 ng/L (ref <18)
Troponin I (High Sensitivity): 2 ng/L (ref <18)

## 2023-06-15 LAB — BRAIN NATRIURETIC PEPTIDE: B Natriuretic Peptide: 31.8 pg/mL (ref 0.0–100.0)

## 2023-06-15 MED ORDER — PREDNISONE 50 MG PO TABS
60.0000 mg | ORAL_TABLET | Freq: Once | ORAL | Status: AC
  Administered 2023-06-15: 60 mg via ORAL
  Filled 2023-06-15: qty 1

## 2023-06-15 MED ORDER — IPRATROPIUM-ALBUTEROL 0.5-2.5 (3) MG/3ML IN SOLN
3.0000 mL | Freq: Once | RESPIRATORY_TRACT | Status: AC
  Administered 2023-06-15: 3 mL via RESPIRATORY_TRACT
  Filled 2023-06-15: qty 3

## 2023-06-15 MED ORDER — IOHEXOL 350 MG/ML SOLN
100.0000 mL | Freq: Once | INTRAVENOUS | Status: AC | PRN
  Administered 2023-06-15: 80 mL via INTRAVENOUS

## 2023-06-15 MED ORDER — PREDNISONE 50 MG PO TABS: 50.0000 mg | ORAL_TABLET | Freq: Every day | ORAL | 0 refills | Status: AC

## 2023-06-15 MED ORDER — IPRATROPIUM-ALBUTEROL 0.5-2.5 (3) MG/3ML IN SOLN: 3.0000 mL | Freq: Once | RESPIRATORY_TRACT | Status: DC

## 2023-06-15 MED ORDER — ALBUTEROL SULFATE HFA 108 (90 BASE) MCG/ACT IN AERS: 2.0000 | INHALATION_SPRAY | RESPIRATORY_TRACT | 0 refills | Status: AC | PRN

## 2023-06-15 NOTE — ED Notes (Signed)
Patient verbalizes understanding of discharge instructions. Opportunity for questioning and answers were provided. Armband removed by staff, pt discharged from ED. Ambulated out to lobby  

## 2023-06-15 NOTE — Discharge Instructions (Signed)
Your evaluation did not show any serious cause for your chest pain and difficulty breathing.  However, you did respond to nebulizer treatment.  I am prescribing an inhaler which you can use every 4 hours as needed.  Return to the emergency department if your symptoms are getting worse.

## 2023-06-15 NOTE — ED Provider Notes (Signed)
[place] EMERGENCY DEPARTMENT AT [facility] Provider Note   CSN: [id number] Arrival date & time: 06/14/23  2342     History  Chief Complaint  Patient presents with   Chest Pain    Emily Morgan is a 32 y.o. female.  The history is provided by the patient.  Chest Pain She has no significant past history and comes in complaining of intermittent chest pain for last 3 days.  Pain is in the left side of the chest with some radiation to the shoulder.  It tends to come on when she lays down or if she changes position after laying down.  Pain will last up to 5 minutes before resolving.  Pain is sharp and there is no associated dyspnea or nausea or diaphoresis.  As a separate issue, she has noted a tight feeling when she tries to take deep breaths and has noted exertional dyspnea for the last several days which is new for her.  She denies any travel, denies exogenous estrogen use.  She is a non-smoker and there is no family history of premature coronary atherosclerosis.   Home Medications Prior to Admission medications   Medication Sig Start Date End Date Taking? Authorizing Provider  acetaminophen (TYLENOL) 500 MG tablet Take 2 tablets (1,000 mg total) by mouth every 8 (eight) hours as needed. 12/02/22 12/02/23  Maczis, Elmer Sow, PA-C  clindamycin (CLEOCIN) 300 MG capsule Take 1 capsule (300 mg total) by mouth 3 (three) times daily. 01/02/23   Serena Colonel, MD  HYDROcodone-acetaminophen (HYCET) 7.5-325 mg/15 ml solution Take 15 mLs by mouth every 6 (six) hours as needed for moderate pain. 01/02/23   Serena Colonel, MD  metFORMIN (GLUCOPHAGE-XR) 500 MG 24 hr tablet Take 500 mg by mouth daily. 07/20/22   [provider]  ondansetron (ZOFRAN-ODT) 4 MG disintegrating tablet Take 1 tablet (4 mg total) by mouth every 8 (eight) hours as needed for nausea or vomiting. 10/20/22   Henderly, Britni A, PA-C  ondansetron (ZOFRAN-ODT) 4 MG disintegrating tablet Take 1 tablet (4 mg total) by mouth  every 8 (eight) hours as needed for nausea or vomiting. 01/02/23   Serena Colonel, MD  ondansetron (ZOFRAN-ODT) 4 MG disintegrating tablet Take 1 tablet (4 mg total) by mouth every 8 (eight) hours as needed for nausea or vomiting. 01/02/23   Serena Colonel, MD  polyethylene glycol (MIRALAX / GLYCOLAX) 17 g packet Take 17 g by mouth daily as needed for mild constipation. 12/02/22   Maczis, Elmer Sow, PA-C  VIENVA 0.1-20 MG-MCG tablet Take 1 tablet by mouth daily. 05/09/22   [provider]  Vitamin D, Ergocalciferol, (DRISDOL) 1.25 MG (50000 UNIT) CAPS capsule Take 50,000 Units by mouth once a week. 04/28/22   [provider]      Allergies    Bee venom, Cat hair extract, and Shellfish allergy    Review of Systems   Review of Systems  Cardiovascular:  Positive for chest pain.  All other systems reviewed and are negative.   Physical Exam Updated Vital Signs BP 110/68   Pulse 91   Temp 98.2 F (36.8 C)   Resp 18   Wt 117.9 kg   LMP 06/01/2023 (Approximate)   SpO2 98%   BMI 41.97 kg/m  Physical Exam Vitals and nursing note reviewed.   32 year old female, resting comfortably and in no acute distress. Vital signs are normal. Oxygen saturation is 98%, which is normal. Head is normocephalic and atraumatic. PERRLA, EOMI. Oropharynx is clear. Neck  is nontender and supple without adenopathy or JVD. Back is nontender and there is no CVA tenderness. Lungs are clear without rales, wheezes, or rhonchi. Chest is nontender. Heart has regular rate and rhythm without murmur. Abdomen is soft, flat, nontender. Extremities have no cyanosis or edema, full range of motion is present. Skin is warm and dry without rash. Neurologic: Mental status is normal, cranial nerves are intact, moves all extremities equally.  ED Results / Procedures / Treatments   Labs (all labs ordered are listed, but only abnormal results are displayed) Labs Reviewed  CBC - Abnormal; Notable for the following  components:      Result Value   Hemoglobin 10.9 (*)    HCT 35.2 (*)    MCH 25.3 (*)    All other components within normal limits  D-DIMER, QUANTITATIVE - Abnormal; Notable for the following components:   D-Dimer, Quant 1.68 (*)    All other components within normal limits  HEPATIC FUNCTION PANEL - Abnormal; Notable for the following components:   Total Protein 9.3 (*)    All other components within normal limits  BASIC METABOLIC PANEL  PREGNANCY, URINE  BRAIN NATRIURETIC PEPTIDE  LIPASE, BLOOD  TROPONIN I (HIGH SENSITIVITY)  TROPONIN I (HIGH SENSITIVITY)    EKG EKG Interpretation Date/Time:  Wednesday June 14 2023 23:46:30 EDT Ventricular Rate:  93 PR Interval:  190 QRS Duration:  74 QT Interval:  330 QTC Calculation: 410 R Axis:   72  Text Interpretation: Normal sinus rhythm Cannot rule out Anterior infarct , age undetermined Abnormal ECG When compared with ECG of 02-Dec-2022 09:42, No significant change was found Confirmed by Dione Booze ([id number]) on 06/14/2023 11:54:33 PM  Radiology CT Angio Chest PE W and/or Wo Contrast  Result Date: 06/15/2023 CLINICAL DATA:  Positive D-dimer and chest pain, initial encounter EXAM: CT ANGIOGRAPHY CHEST WITH CONTRAST TECHNIQUE: Multidetector CT imaging of the chest was performed using the standard protocol during bolus administration of intravenous contrast. Multiplanar CT image reconstructions and MIPs were obtained to evaluate the vascular anatomy. RADIATION DOSE REDUCTION: This exam was performed according to the departmental dose-optimization program which includes automated exposure control, adjustment of the mA and/or kV according to patient size and/or use of iterative reconstruction technique. CONTRAST:  80mL OMNIPAQUE IOHEXOL 350 MG/ML SOLN COMPARISON:  Chest x-ray from earlier in the same day. FINDINGS: Cardiovascular: Thoracic aorta and its branches are within normal limits. No cardiac enlargement is seen. The pulmonary artery shows  a normal branching pattern. No filling defect to suggest pulmonary embolism is seen. No coronary calcifications are noted. Mediastinum/Nodes: Thoracic inlet is within normal limits. No hilar or mediastinal adenopathy is noted. The esophagus as visualized is within normal limits. Lungs/Pleura: The lungs are well aerated bilaterally. No focal infiltrate, effusion or pneumothorax is seen. No parenchymal nodules are noted. Upper Abdomen: Visualized upper abdomen shows no acute abnormality. Musculoskeletal: No chest wall abnormality. No acute or significant osseous findings. Review of the MIP images confirms the above findings. IMPRESSION: No evidence of pulmonary emboli. No acute abnormality noted. Electronically Signed   By: Alcide Clever M.D.   On: 06/15/2023 03:07   DG Chest 2 View  Result Date: 06/15/2023 CLINICAL DATA:  Chest pain. EXAM: CHEST - 2 VIEW COMPARISON:  02/24/2022. FINDINGS: The heart size and mediastinal contours are within normal limits. Both lungs are clear. No acute osseous abnormality. IMPRESSION: No active cardiopulmonary disease. Electronically Signed   By: Thornell Sartorius M.D.   On: 06/15/2023 00:26  Procedures Procedures    Medications Ordered in ED Medications  predniSONE (DELTASONE) tablet 60 mg (has no administration in time range)  ipratropium-albuterol (DUONEB) 0.5-2.5 (3) MG/3ML nebulizer solution 3 mL (3 mLs Nebulization Given 06/15/23 0211)  iohexol (OMNIPAQUE) 350 MG/ML injection 100 mL (80 mLs Intravenous Contrast Given 06/15/23 0248)    ED Course/ Medical Decision Making/ A&P                                 Medical Decision Making Amount and/or Complexity of Data Reviewed Labs: ordered. Radiology: ordered.  Risk Prescription drug management.   Chest pain of uncertain cause.  Differential diagnosis includes, but is not limited to, angina equivalent, GERD, musculoskeletal pain, pulmonary embolism, pneumonia, pericarditis.  It is certainly atypical for cardiac  pain.  I have reviewed and interpreted her electrocardiogram, and my interpretation is essentially normal ECG unchanged from prior.  Chest x-ray shows no acute cardiopulmonary process.  Have independently viewed the images, and agree with the radiologist's interpretation.  I have reviewed and interpreted her laboratory tests, and my interpretation is normal basic metabolic panel, normal troponin, stable anemia, negative pregnancy test.  Because of new onset of exertional dyspnea, I have added laboratory testing of BNP, hepatic function panel, D-dimer.  BNP is normal, hepatic function panel was unremarkable, D-dimer is significantly elevated so I have ordered CT angiogram of the chest.  Of note, patient did note some improvement in the pressure feeling in her chest with the nebulizer treatment.  I have ordered a dose of prednisone.  CT angiogram shows no evidence of pulmonary embolism, pneumonia, or other acute process.  Have independently viewed the images, and agree with the radiologist's interpretation.  I am discharging her with prescriptions for prednisone and albuterol inhaler.  Follow-up with PCP.  Return precautions discussed.  Final Clinical Impression(s) / ED Diagnoses Final diagnoses:  Shortness of breath  Nonspecific chest pain    Rx / DC Orders ED Discharge Orders          Ordered    predniSONE (DELTASONE) 50 MG tablet  Daily        06/15/23 0324    albuterol (VENTOLIN HFA) 108 (90 Base) MCG/ACT inhaler  Every 4 hours PRN        06/15/23 0324              Dione Booze, MD 06/15/23 [phone]

## 2024-11-23 ENCOUNTER — Emergency Department (HOSPITAL_BASED_OUTPATIENT_CLINIC_OR_DEPARTMENT_OTHER)

## 2024-11-23 ENCOUNTER — Emergency Department (HOSPITAL_BASED_OUTPATIENT_CLINIC_OR_DEPARTMENT_OTHER)
Admission: EM | Admit: 2024-11-23 | Discharge: 2024-11-23 | Disposition: A | Discharge status: Home / Self Care | Attending: Emergency Medicine | Admitting: Emergency Medicine

## 2024-11-23 ENCOUNTER — Encounter (HOSPITAL_BASED_OUTPATIENT_CLINIC_OR_DEPARTMENT_OTHER): Payer: Self-pay | Admitting: Emergency Medicine

## 2024-11-23 DIAGNOSIS — M25512 Pain in left shoulder: Secondary | ICD-10-CM | POA: Insufficient documentation

## 2024-11-23 DIAGNOSIS — R7303 Prediabetes: Secondary | ICD-10-CM | POA: Insufficient documentation

## 2024-11-23 DIAGNOSIS — Z79899 Other long term (current) drug therapy: Secondary | ICD-10-CM | POA: Insufficient documentation

## 2024-11-23 DIAGNOSIS — D72829 Elevated white blood cell count, unspecified: Secondary | ICD-10-CM | POA: Insufficient documentation

## 2024-11-23 DIAGNOSIS — R52 Pain, unspecified: Secondary | ICD-10-CM

## 2024-11-23 DIAGNOSIS — G8929 Other chronic pain: Secondary | ICD-10-CM | POA: Insufficient documentation

## 2024-11-23 DIAGNOSIS — M549 Dorsalgia, unspecified: Secondary | ICD-10-CM | POA: Insufficient documentation

## 2024-11-23 DIAGNOSIS — Z7984 Long term (current) use of oral hypoglycemic drugs: Secondary | ICD-10-CM | POA: Diagnosis not present

## 2024-11-23 LAB — URINALYSIS, ROUTINE W REFLEX MICROSCOPIC
Bilirubin Urine: NEGATIVE
Glucose, UA: NEGATIVE mg/dL
Hgb urine dipstick: NEGATIVE
Ketones, ur: NEGATIVE mg/dL
Nitrite: NEGATIVE
Protein, ur: NEGATIVE mg/dL
Specific Gravity, Urine: 1.022 (ref 1.005–1.030)
pH: 6.5 (ref 5.0–8.0)

## 2024-11-23 LAB — PREGNANCY, URINE: Preg Test, Ur: NEGATIVE

## 2024-11-23 MED ORDER — LIDOCAINE 5 % EX PTCH
1.0000 | MEDICATED_PATCH | CUTANEOUS | Status: DC
  Administered 2024-11-23: 1 via TRANSDERMAL
  Filled 2024-11-23: qty 1

## 2024-11-23 MED ORDER — KETOROLAC TROMETHAMINE 15 MG/ML IJ SOLN
15.0000 mg | Freq: Once | INTRAMUSCULAR | Status: AC
  Administered 2024-11-23: 15 mg via INTRAMUSCULAR
  Filled 2024-11-23: qty 1

## 2024-11-23 NOTE — ED Provider Notes (Signed)
 " [place] [facility]MENT AT [facility] Provider Note   CSN: [id number] Arrival date & time: 11/23/24  1210     Patient presents with: Back Pain   Emily Morgan is a 34 y.o. female.   Patient with history of degenerative disc disease, PCOS presents today with complaints of left side pain.  Reports that symptoms began 3 days ago.  Does report that she exercised at the gym the day before, however denies any specific notable injuries during this.  Woke up the next day with pain.  Reports that her pain is only present with specific movements.  Pain is worse on the left side but does radiate into her left axilla as well as her left flank. Reports pain feels sharp. Denies history of similar pain previously. Does report that she has had chronic pain in her left shoulder for the past 2-3 months, has not been seen or evaluated for this pain.  Reports that since her left side has been bothering her, it seems like the left shoulder has been hurting worse as well.  Reports she took 1 Robaxin yesterday without any improvement.  She has not taken any other medicines for her symptoms.  Denies any fevers, chills, chest pain, shortness of breath, nausea, vomiting, diarrhea, or abdominal pain.  No dysuria, hematuria, or vaginal discharge.  No midline back pain.  No sharp shooting pain in her extremities or numbness/tingling.  No weakness.  No loss of bowel or bladder function or saddle anesthesia.  She is able to ambulate without pain.  The history is provided by the patient. No language interpreter was used.  Back Pain      Prior to Admission medications  Medication Sig Start Date End Date Taking? Authorizing Provider  albuterol  (VENTOLIN  HFA) 108 (90 Base) MCG/ACT inhaler Inhale 2 puffs into the lungs every 4 (four) hours as needed for wheezing or shortness of breath. 06/15/23   Raford Lenis, MD  clindamycin  (CLEOCIN ) 300 MG capsule Take 1 capsule (300 mg total) by mouth 3 (three) times  daily. 01/02/23   Jesus Oliphant, MD  HYDROcodone -acetaminophen  (HYCET) 7.5-325 mg/15 ml solution Take 15 mLs by mouth every 6 (six) hours as needed for moderate pain. 01/02/23   Jesus Oliphant, MD  metFORMIN (GLUCOPHAGE-XR) 500 MG 24 hr tablet Take 500 mg by mouth daily. 07/20/22   [provider]  ondansetron  (ZOFRAN -ODT) 4 MG disintegrating tablet Take 1 tablet (4 mg total) by mouth every 8 (eight) hours as needed for nausea or vomiting. 10/20/22   Henderly, Britni A, PA-C  ondansetron  (ZOFRAN -ODT) 4 MG disintegrating tablet Take 1 tablet (4 mg total) by mouth every 8 (eight) hours as needed for nausea or vomiting. 01/02/23   Jesus Oliphant, MD  ondansetron  (ZOFRAN -ODT) 4 MG disintegrating tablet Take 1 tablet (4 mg total) by mouth every 8 (eight) hours as needed for nausea or vomiting. 01/02/23   Jesus Oliphant, MD  polyethylene glycol (MIRALAX  / GLYCOLAX ) 17 g packet Take 17 g by mouth daily as needed for mild constipation. 12/02/22   Maczis, Michael M, PA-C  predniSONE  (DELTASONE ) 50 MG tablet Take 1 tablet (50 mg total) by mouth daily. 06/15/23   Raford Lenis, MD  VIENVA 0.1-20 MG-MCG tablet Take 1 tablet by mouth daily. 05/09/22   [provider]  Vitamin D, Ergocalciferol, (DRISDOL) 1.25 MG (50000 UNIT) CAPS capsule Take 50,000 Units by mouth once a week. 04/28/22   [provider]    Allergies: Bee venom, Cat dander, and Shellfish allergy  Review of Systems  Musculoskeletal:  Positive for back pain.  All other systems reviewed and are negative.   Updated Vital Signs BP 121/63 (BP Location: Right Arm)   Pulse 80   Temp 97.8 F (36.6 C)   Resp 16   LMP 10/02/2024 (Approximate)   SpO2 100%   Physical Exam Vitals and nursing note reviewed.  Constitutional:      General: She is not in acute distress.    Appearance: Normal appearance. She is normal weight. She is not ill-appearing, toxic-appearing or diaphoretic.  HENT:     Head: Normocephalic and atraumatic.   Cardiovascular:     Rate and Rhythm: Normal rate and regular rhythm.     Heart sounds: Normal heart sounds.  Pulmonary:     Effort: Pulmonary effort is normal. No respiratory distress.     Breath sounds: Normal breath sounds.  Abdominal:     General: Abdomen is flat.     Palpations: Abdomen is soft.     Tenderness: There is no abdominal tenderness.  Musculoskeletal:        General: Normal range of motion.     Cervical back: Normal range of motion.     Comments: No midline tenderness to palpation of the cervical, thoracic, or lumbar spine.  No step-offs, lesions, deformity, or overlying skin changes.  Mild ttp noted to the left shoulder area. Pain with flexion above 90 degrees. Strong radial pulse. 5/5 strength and sensation intact bilateral upper and lower extremities.  No reproducible pain to palpation of the left side area. No skin lesions. Able to hold both legs off the bed without issue or pain. - SLR bilaterally.  DP and PT pulses intact and 2+ bilaterally.  Skin:    General: Skin is warm and dry.  Neurological:     General: No focal deficit present.     Mental Status: She is alert.  Psychiatric:        Mood and Affect: Mood normal.        Behavior: Behavior normal.     (all labs ordered are listed, but only abnormal results are displayed) Labs Reviewed  URINALYSIS, ROUTINE W REFLEX MICROSCOPIC - Abnormal; Notable for the following components:      Result Value   Leukocytes,Ua SMALL (*)    Bacteria, UA RARE (*)    All other components within normal limits  PREGNANCY, URINE    EKG: None  Radiology: DG Shoulder Left Result Date: 11/23/2024 EXAM: 1 VIEW(S) XRAY OF THE LEFT SHOULDER 11/23/2024 01:04:34 PM COMPARISON: None available. CLINICAL HISTORY: Left shoulder pain. FINDINGS: BONES AND JOINTS: Glenohumeral joint is normally aligned. No acute fracture. No malalignment. The [facility] joint is unremarkable. SOFT TISSUES: No abnormal calcifications. Visualized lung is  unremarkable. IMPRESSION: 1. No acute osseous abnormality. Electronically signed by: Ryan Salvage MD 11/23/2024 01:35 PM EST RP Workstation: [id number]     Procedures   Medications Ordered in the ED  lidocaine  (LIDODERM ) 5 % 1 patch (1 patch Transdermal Patch Applied 11/23/24 1301)  ketorolac  (TORADOL ) 15 MG/ML injection 15 mg (15 mg Intramuscular Given 11/23/24 1253)                                    Medical Decision Making Amount and/or Complexity of Data Reviewed Labs: ordered. Radiology: ordered.  Risk Prescription drug management.   This patient is a 34 y.o. female who presents to the ED for concern of  left side pain, this involves an extensive number of treatment options, and is a complaint that carries with it a high risk of complications and morbidity. The emergent differential diagnosis prior to evaluation includes, but is not limited to,  muscle strain, shingles, UTI, pyelonephritis, sciatica, radiculopathy, ACS/PE . This is not an exhaustive differential.   Past Medical History / Co-morbidities / Social History:  has a past medical history of Complication of anesthesia, Degenerative lumbar disc, PCOS (polycystic ovarian syndrome), Pre-diabetes, and Sleep apnea.  Additional history: Chart reviewed.   Physical Exam: Physical exam performed. The pertinent findings include:  No midline tenderness to palpation of the cervical, thoracic, or lumbar spine.  No step-offs, lesions, deformity, or overlying skin changes.  Mild ttp noted to the left shoulder area. Pain with flexion above 90 degrees. Strong radial pulse. 5/5 strength and sensation intact bilateral upper and lower extremities.  No reproducible pain to palpation of the left side area. No skin lesions. Able to hold both legs off the bed without issue or pain. - SLR bilaterally.  DP and PT pulses intact and 2+ bilaterally.  Lab Tests: I ordered, and personally interpreted labs.  The pertinent results include:  UA  with small leukocytes, 6-10 WBCs, rare bacteria   Imaging Studies: I ordered imaging studies including dg left shoulder. I independently visualized and interpreted imaging which showed no acute findings. I agree with the radiologist interpretation.   Medications: I ordered medication including toradol , lidocaine  patch  for pain. Reevaluation of the patient after these medicines showed that the patient improved. I have reviewed the patients home medicines and have made adjustments as needed.   Disposition: After consideration of the diagnostic results and the patients response to treatment, I feel that emergency department workup does not suggest an emergent condition requiring admission or immediate intervention beyond what has been performed at this time. The plan is: Discharge with close outpatient follow-up and return precautions.  Patient's workup is benign, after Toradol , she feels significantly improved and ready to go home.  I suspect patient's pain is muscular in nature, discussed same with patient is understanding and in agreement with this. Doubt intrabdominal etiology as no abdominal tenderness, nausea/vomiting/diarrhea. No hematuria to suggest nephrolithiasis.  Her urine did show some signs of infection, however patient denies any urinary symptoms and does not want to be treated for UTI at this time as her symptoms are not consistent with UTIs she has had in the past. She has no chest pain or shortness of breath or significant cardiac risk factors to suggest abnormal presentation of ACS.  No risk factors or shortness of breath to suggest PE/AAA.  No signs or symptoms or risk factors for spinal epidural abscess or cauda equina.  I did offer further evaluation with labs and imaging, however patient would prefer to go home and monitor her symptoms and follow-up with close return precautions.  Recommend NSAIDs/tylenol . Offered prescription medicines which patient declined. Evaluation and diagnostic  testing in the emergency department does not suggest an emergent condition requiring admission or immediate intervention beyond what has been performed at this time.  Plan for discharge with close PCP follow-up.  Patient is understanding and amenable with plan, educated on red flag symptoms that would prompt immediate return.  Patient discharged in stable condition.  Final diagnoses:  Pain of left side of body  Chronic left shoulder pain    ED Discharge Orders     None     An After Visit Summary was printed  and given to the patient.      Nora Lauraine DELENA DEVONNA 11/23/24 1349    Ruthe Cornet, DO 11/23/24 1359  "

## 2024-11-23 NOTE — ED Notes (Signed)
 Reviewed discharge instructions and home care with pt. Pt verbalized understanding and had no further questions. Pt exited ED without complications.

## 2024-11-23 NOTE — Discharge Instructions (Signed)
 As we discussed, your workup in the ER today was reassuring for acute findings.  Laboratory evaluation and x-ray imaging did not reveal any emergent cause of your symptoms.  Given that you feel better with Toradol , no further evaluation is indicated.  I suspect you have sustained a muscle strain, I recommend that you get plenty of rest and take Tylenol /ibuprofen as needed for pain.  Please use acetaminophen  (Tylenol ) or ibuprofen (Advil, Motrin) for pain.  You may use 800 mg ibuprofen every 6 hours or 1000 mg of acetaminophen  every 6 hours.  You may choose to alternate between the two, this would be most effective. Do not exceed 4000 mg of acetaminophen  within 24 hours.  Do not exceed 3200 mg ibuprofen within 24 hours.  Call your primary care provider to schedule close follow-up appointment at your earliest convenience.  Return if development of any new or worsening symptoms.

## 2024-11-23 NOTE — ED Triage Notes (Signed)
 Left side back pain and shoulder pain x 2 days Worse with turning to right Pain is limiting sleep
